# Patient Record
Sex: Female | Born: 1985 | Race: Black or African American | Hispanic: No | Marital: Married | Smoking: Never smoker
Health system: Southern US, Community
[De-identification: ages and names within clinical notes are randomized; demographics above are authoritative.]

## PROBLEM LIST (undated history)

## (undated) ENCOUNTER — Ambulatory Visit (HOSPITAL_COMMUNITY): Admission: RE | Payer: BC Managed Care – PPO | Source: Ambulatory Visit | Admitting: Obstetrics and Gynecology

## (undated) DIAGNOSIS — N390 Urinary tract infection, site not specified: Secondary | ICD-10-CM

## (undated) DIAGNOSIS — F419 Anxiety disorder, unspecified: Secondary | ICD-10-CM

## (undated) DIAGNOSIS — R011 Cardiac murmur, unspecified: Secondary | ICD-10-CM

## (undated) DIAGNOSIS — I10 Essential (primary) hypertension: Secondary | ICD-10-CM

## (undated) DIAGNOSIS — E669 Obesity, unspecified: Secondary | ICD-10-CM

## (undated) HISTORY — DX: Essential (primary) hypertension: I10

## (undated) HISTORY — DX: Obesity, unspecified: E66.9

## (undated) HISTORY — PX: WISDOM TOOTH EXTRACTION: SHX21

---
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
## 2005-03-23 HISTORY — PX: CHOLECYSTECTOMY OPEN: SUR202

## 2005-06-22 ENCOUNTER — Other Ambulatory Visit: Admission: RE | Admit: 2005-06-22 | Discharge: 2005-06-22 | Payer: Self-pay | Admitting: Obstetrics and Gynecology

## 2005-06-25 ENCOUNTER — Encounter: Admission: RE | Admit: 2005-06-25 | Discharge: 2005-06-25 | Payer: Self-pay | Admitting: Obstetrics and Gynecology

## 2005-09-04 ENCOUNTER — Emergency Department (HOSPITAL_COMMUNITY): Admission: EM | Admit: 2005-09-04 | Discharge: 2005-09-04 | Payer: Self-pay | Admitting: *Deleted

## 2006-02-12 ENCOUNTER — Ambulatory Visit (HOSPITAL_COMMUNITY): Admission: RE | Admit: 2006-02-12 | Discharge: 2006-02-12 | Payer: Self-pay | Admitting: General Surgery

## 2006-02-12 ENCOUNTER — Encounter (INDEPENDENT_AMBULATORY_CARE_PROVIDER_SITE_OTHER): Payer: Self-pay | Admitting: Specialist

## 2009-06-20 ENCOUNTER — Other Ambulatory Visit: Admission: RE | Admit: 2009-06-20 | Discharge: 2009-06-20 | Payer: Self-pay | Admitting: Obstetrics and Gynecology

## 2010-03-23 DIAGNOSIS — I10 Essential (primary) hypertension: Secondary | ICD-10-CM

## 2010-03-23 HISTORY — DX: Essential (primary) hypertension: I10

## 2010-08-08 NOTE — Op Note (Signed)
NAMEKALISI, BEVILL NO.:  [id number]   MEDICAL RECORD NO.:  [id number]          PATIENT TYPE:  AMB   LOCATION:  DAY                          FACILITY:  [facility]   PHYSICIAN:  Leonie Man, M.D.   DATE OF BIRTH:  [birth year]   DATE OF PROCEDURE:  02/12/2006  DATE OF DISCHARGE:                               OPERATIVE REPORT   PREOPERATIVE DIAGNOSIS:  Chronic calculous cholecystitis.   POSTOPERATIVE DIAGNOSIS:  Chronic calculous cholecystitis.   PROCEDURE:  Laparoscopic cholecystectomy with intraoperative  cholangiogram.   SURGEON:  Leonie Man, M.D.   ASSISTANT:  Maisie Fus A. Cornett, M.D.   ANESTHESIA:  General general.   INDICATIONS FOR PROCEDURE:  Ms. Dawn Dawson is a 25 year old female presenting  with recurrent episodes of her upper abdominal pain, associated with  nausea but without vomiting.  She has had very persistent episodes of  __________  and had been to the emergency room on several occasions for  these attacks.  The attacks tended to resolve spontaneously.  She had an  abdominal ultrasound which confirmed the presence of cholelithiasis.  The inferior extrahepatic ducts appeared to be within normal limits.  Liver function studies were also within normal limits.  The patient  comes to the operating room now after risks and potential benefits of  surgery had been discussed, all questions answered, and consent was  obtained for the same.   PROCEDURE:  The patient was positioned supine, and following the  induction of satisfactory general anesthesia, the abdomen was prepped  and draped to be included in a sterile operative field.  Open  laparoscopy was created at the umbilicus with insertion of a Hassan  cannula.  The abdominal cavity was insufflated with carbon dioxide to 14  millimeters of mercury pressure.  The camera was inserted and visual  exploration carried out.  The gallbladder was noted to be chronically  scarred with multiple adhesions up  to the wall of the gallbladder.  The  liver edge was somewhat rounded, and the liver surface was smooth.  The  anterior gastric wall and duodenal sweep appeared to be within normal  limits.  None of the large or small intestine visualized appeared to be  abnormal.   Under direct vision, epigastric and lateral ports were placed.  The  gallbladder was grasped and retracted cephalad.  Dissection was carried  down towards the ampulla, removing the adhesions of the omentum to the  gallbladder wall.  At the region of the ampulla, the cystic artery and  cystic duct were then isolated, and the cystic duct being traced up to  the cystic duct and gallbladder junction, and the cystic artery traced  into the wall of the gallbladder.  The cystic artery was then triply  clipped and transected.  The cystic duct was clipped proximally and  opened.  The cystic duct cholangiogram was carried out by passing a Cook  catheter into the abdomen and into the cystic duct, through which half-  strength Hypaque was injected under fluoroscopic control.  The resulting  cholangiogram showed prompt flow of contrast into the duodenum, normal  caliber  ducts, and no filling defects in any of the extrahepatic biliary  system.  Cystic duct catheter was removed, and the cystic duct was  triply clipped and transected.  The gallbladder was then dissected free  from the liver bed using electrocautery and maintaining hemostasis  throughout the entire course of the dissection.  At the end of  dissection, the liver bed was checked for hemostasis.  Additional  bleeding points treated electrocautery.  Right upper quadrant was  irrigated with normal saline.  The gallbladder was placed in an  Endopouch and retrieved through the umbilical wound without difficulty.   Sponge, instrument, and sharp counts were verified.  Trocars were  removed under direct vision, and pneumoperitoneum allowed to deflate.  All wounds were then closed in  layers as follows:  The umbilical wound  in two layers with #0 Dexon and 4-0 Monocryl, epigastric and flank  wounds were closed with 4-0 Monocryl sutures.  All wounds were  reinforced with Steri-Strips.  Sterile dressings applied.  Anesthetic  reversed.  The patient removed from the operating room to the recovery  room in stable condition.  She tolerated the procedure well.      Leonie Man, M.D.  Electronically Signed     PB/MEDQ  D:  02/12/2006  T:  02/12/2006  Job:  [id number]

## 2010-11-10 ENCOUNTER — Other Ambulatory Visit: Payer: Self-pay | Admitting: Family Medicine

## 2010-11-10 DIAGNOSIS — IMO0002 Reserved for concepts with insufficient information to code with codable children: Secondary | ICD-10-CM

## 2010-11-18 ENCOUNTER — Inpatient Hospital Stay: Admission: RE | Admit: 2010-11-18 | Payer: Self-pay | Source: Ambulatory Visit

## 2010-12-18 ENCOUNTER — Inpatient Hospital Stay: Admission: RE | Admit: 2010-12-18 | Payer: Self-pay | Source: Ambulatory Visit

## 2011-01-01 ENCOUNTER — Other Ambulatory Visit: Payer: Self-pay

## 2011-09-16 ENCOUNTER — Encounter: Payer: Self-pay | Admitting: Obstetrics and Gynecology

## 2011-09-16 ENCOUNTER — Ambulatory Visit (INDEPENDENT_AMBULATORY_CARE_PROVIDER_SITE_OTHER): Payer: BC Managed Care – PPO | Admitting: Obstetrics and Gynecology

## 2011-09-16 ENCOUNTER — Ambulatory Visit (INDEPENDENT_AMBULATORY_CARE_PROVIDER_SITE_OTHER): Payer: BC Managed Care – PPO

## 2011-09-16 ENCOUNTER — Other Ambulatory Visit: Payer: Self-pay | Admitting: Obstetrics and Gynecology

## 2011-09-16 DIAGNOSIS — O26849 Uterine size-date discrepancy, unspecified trimester: Secondary | ICD-10-CM

## 2011-09-16 DIAGNOSIS — Z1389 Encounter for screening for other disorder: Secondary | ICD-10-CM

## 2011-09-16 DIAGNOSIS — Z331 Pregnant state, incidental: Secondary | ICD-10-CM

## 2011-09-16 DIAGNOSIS — Z3687 Encounter for antenatal screening for uncertain dates: Secondary | ICD-10-CM

## 2011-09-16 LAB — POCT URINALYSIS DIPSTICK
Blood, UA: 2
Ketones, UA: NEGATIVE
Urobilinogen, UA: NEGATIVE
pH, UA: 6

## 2011-09-16 NOTE — Progress Notes (Signed)
Pt here for NOB interview Hx CHTN was on aldomet 500 BID - dc'd w +UPT BP today normal Review of [place] for dating only SIUP AUA = [redacted]w[redacted]d - EDC=05/03/2012 FHR 145 +amnion/yolk sac Ovaries/adenxa WNL Corpus luteum on LTOV No free fluid  Pt has NOB w/u next week

## 2011-09-17 LAB — PRENATAL PANEL VII
Antibody Screen: NEGATIVE
Basophils Absolute: 0 10*3/uL (ref 0.0–0.1)
Basophils Relative: 0 % (ref 0–1)
Eosinophils Absolute: 0.1 10*3/uL (ref 0.0–0.7)
Eosinophils Relative: 1 % (ref 0–5)
HCT: 36.7 % (ref 36.0–46.0)
HIV: NONREACTIVE
Hemoglobin: 12.3 g/dL (ref 12.0–15.0)
Lymphocytes Relative: 22 % (ref 12–46)
MCH: 26.8 pg (ref 26.0–34.0)
MCV: 80 fL (ref 78.0–100.0)
RDW: 14.4 % (ref 11.5–15.5)
Rubella: 32.7 IU/mL — ABNORMAL HIGH
WBC: 10.9 10*3/uL — ABNORMAL HIGH (ref 4.0–10.5)

## 2011-09-18 ENCOUNTER — Telehealth: Payer: Self-pay | Admitting: Obstetrics and Gynecology

## 2011-09-18 LAB — HEMOGLOBINOPATHY EVALUATION
Hemoglobin Other: 0 %
Hgb A2 Quant: 2.8 % (ref 2.2–3.2)
Hgb S Quant: 0 %

## 2011-09-18 MED ORDER — ONDANSETRON HCL 4 MG PO TABS: 4.0000 mg | ORAL_TABLET | Freq: Three times a day (TID) | ORAL | Status: AC | PRN

## 2011-09-18 NOTE — Telephone Encounter (Deleted)
Pt had nurse interview 09/16/11 and states she had a quick appointment with you afterward. Pt c/o nausea throughout the day but no vomiting. Is it okay to call in Zofran?

## 2011-09-18 NOTE — Telephone Encounter (Signed)
Zofran ok to call in for nausea per Chip Boer. Rx sent to pt pharmacy, pt notified and voiced understanding.

## 2011-09-18 NOTE — Telephone Encounter (Signed)
Triage/epic 

## 2011-09-20 DIAGNOSIS — Z22330 Carrier of Group B streptococcus: Secondary | ICD-10-CM | POA: Insufficient documentation

## 2011-09-22 ENCOUNTER — Encounter: Payer: Self-pay | Admitting: Obstetrics and Gynecology

## 2011-09-25 ENCOUNTER — Ambulatory Visit (INDEPENDENT_AMBULATORY_CARE_PROVIDER_SITE_OTHER): Payer: BC Managed Care – PPO

## 2011-09-25 VITALS — BP 122/70 | Ht 63.0 in | Wt 283.0 lb

## 2011-09-25 DIAGNOSIS — I10 Essential (primary) hypertension: Secondary | ICD-10-CM

## 2011-09-25 DIAGNOSIS — K30 Functional dyspepsia: Secondary | ICD-10-CM

## 2011-09-25 DIAGNOSIS — Z3400 Encounter for supervision of normal first pregnancy, unspecified trimester: Secondary | ICD-10-CM

## 2011-09-25 DIAGNOSIS — Z331 Pregnant state, incidental: Secondary | ICD-10-CM

## 2011-09-25 DIAGNOSIS — K3189 Other diseases of stomach and duodenum: Secondary | ICD-10-CM

## 2011-09-25 DIAGNOSIS — E669 Obesity, unspecified: Secondary | ICD-10-CM

## 2011-09-25 MED ORDER — PANTOPRAZOLE SODIUM 40 MG PO TBEC: 40.0000 mg | DELAYED_RELEASE_TABLET | Freq: Every day | ORAL | Status: DC

## 2011-09-25 NOTE — Progress Notes (Signed)
Pt c/o nausea, acid reflux. Wanting to get rx phenergan states that the zofran is making her too constipated.

## 2011-10-02 LAB — US OB TRANSVAGINAL

## 2011-10-04 DIAGNOSIS — E669 Obesity, unspecified: Secondary | ICD-10-CM

## 2011-10-04 DIAGNOSIS — I10 Essential (primary) hypertension: Secondary | ICD-10-CM | POA: Insufficient documentation

## 2011-10-04 DIAGNOSIS — Z3400 Encounter for supervision of normal first pregnancy, unspecified trimester: Secondary | ICD-10-CM | POA: Insufficient documentation

## 2011-10-04 HISTORY — DX: Obesity, unspecified: E66.9

## 2011-10-04 NOTE — Progress Notes (Signed)
..   Subjective:    Dawn Dawson is being seen today for her first obstetrical visit at [redacted]w[redacted]d.  She reports indigestion & constipation; some nausea.  Her obstetrical history is significant for: Patient Active Problem List  Diagnosis  . GBS carrier  . Obese  . Pregnancy, first  . HTN (hypertension)    Relationship with FOB:  Involved and at appt.  Patient does intend to breast feed.   Pregnancy history fully reviewed.  The following portions of the patient's history were reviewed and updated as appropriate: allergies, current medications, past family history, past medical history, past social history, past surgical history and problem list.  Review of Systems Pertinent ROS is described in HPI   Objective:   BP 122/70  Ht 5\' 3"  (1.6 m)  Wt 283 lb (128.368 kg)  BMI 50.13 kg/m2 Wt Readings from Last 1 Encounters:  09/25/11 283 lb (128.368 kg)   BMI: Body mass index is 50.13 kg/(m^2).  General: alert, cooperative and no distress Respiratory: clear to auscultation bilaterally Cardiovascular: regular rate and rhythm Breasts:  No dominant masses, nipples erect Gastrointestinal: soft, non-tender; no masses,  no organomegaly Extremities: extremities normal, no pain or edema Vaginal Bleeding: None  Pelvic deferred today. Pt unsure of last Pap and going to get exact date by next visit. Her birth month is Sept, so pt agreeable to do then if has been 104yr by that point. FHR:  N/a.  Assessment:    Pregnancy at  [redacted]w[redacted]d. Obese. CHTN--on no meds currently.  GBS pos bacteriuria. Plan:     Prenatal panel reviewed and discussed with the patient:yes Pap smear collected:no GC/Chlamydia collected:no Wet prep:  N/a. Discussion of Genetic testing options: to decide on 1st trimester screen. Prenatal vitamins recommended Problem list reviewed and updated.  Plan of care: Follow up in 4 weeks for ROB or prn. Protonix rx'd.  Disc'd diet and weight gain at length. Plan early 1hr  gtt.  Antonietta Breach CNM, [place] 10/04/2011 2:56 PM

## 2011-10-20 ENCOUNTER — Ambulatory Visit (INDEPENDENT_AMBULATORY_CARE_PROVIDER_SITE_OTHER): Payer: BC Managed Care – PPO

## 2011-10-20 ENCOUNTER — Ambulatory Visit (INDEPENDENT_AMBULATORY_CARE_PROVIDER_SITE_OTHER): Payer: BC Managed Care – PPO | Admitting: Obstetrics and Gynecology

## 2011-10-20 ENCOUNTER — Encounter: Payer: Self-pay | Admitting: Obstetrics and Gynecology

## 2011-10-20 VITALS — BP 116/74 | Wt 278.0 lb

## 2011-10-20 DIAGNOSIS — Z331 Pregnant state, incidental: Secondary | ICD-10-CM

## 2011-10-20 DIAGNOSIS — E669 Obesity, unspecified: Secondary | ICD-10-CM

## 2011-10-20 DIAGNOSIS — O368390 Maternal care for abnormalities of the fetal heart rate or rhythm, unspecified trimester, not applicable or unspecified: Secondary | ICD-10-CM

## 2011-10-20 LAB — US OB LIMITED

## 2011-10-20 NOTE — Progress Notes (Signed)
C/O: indigestion, meds are not helping. Pls resend rx for protonix to pharmacy again was not at pharmacy when she went for pick up.

## 2011-10-20 NOTE — Progress Notes (Signed)
Patient ID: [name], female   DOB: [birth year], 26 y.o.   MRN: [id number] [place] for fhts 159. Travel precautions discussed 8 water daily, frequent voids, ambulate q 2 hours to avoid blood clots. Lavera Guise, CNM

## 2011-11-18 ENCOUNTER — Encounter: Payer: Self-pay | Admitting: Obstetrics and Gynecology

## 2011-11-18 ENCOUNTER — Ambulatory Visit (INDEPENDENT_AMBULATORY_CARE_PROVIDER_SITE_OTHER): Payer: BC Managed Care – PPO | Admitting: Obstetrics and Gynecology

## 2011-11-18 VITALS — BP 130/78 | Wt 281.0 lb

## 2011-11-18 DIAGNOSIS — Z3689 Encounter for other specified antenatal screening: Secondary | ICD-10-CM

## 2011-11-18 DIAGNOSIS — Z331 Pregnant state, incidental: Secondary | ICD-10-CM

## 2011-11-18 NOTE — Progress Notes (Signed)
C/o HA's no visual disturbances  C/o aching "cramping" in legs C/o NV wants to change from Zofran to Phenergan

## 2011-11-18 NOTE — Progress Notes (Signed)
Mild HAs, temporal--1-2x  Since LV.  Some leg cramps at night. Nausea still persists, but is better.  Using Zofran, but makes sleepy.  Discussed Phenergan will do the same and likely cause more sedation.  Patient will stick with Zofran. 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 [place] at 20 weeks at [facility] due to body habitus. Declines genetic testing. Glucola NV.

## 2011-11-19 ENCOUNTER — Telehealth: Payer: Self-pay

## 2011-11-19 NOTE — Telephone Encounter (Signed)
Scheduled ana u/s @ 10:30 am due to elevated BMI at [facility], 1 gtt, and ROB with VL 12/11/11 after u/s. Pt agrees and voices understanding.

## 2011-12-02 ENCOUNTER — Telehealth: Payer: Self-pay | Admitting: Obstetrics and Gynecology

## 2011-12-02 NOTE — Telephone Encounter (Signed)
Pt was called back and made aware she can take Robitussin (plain) Pt asked is can use tablets  I consulted w/ NS , RN and she advised that its best to take the syrup robitussin. Pt voiced understanding.  [facility] CMA

## 2011-12-04 ENCOUNTER — Telehealth: Payer: Self-pay | Admitting: Obstetrics and Gynecology

## 2011-12-04 NOTE — Telephone Encounter (Signed)
Tc from pt. Pt c/o abdominal discomfort;no pain. No vaginal bldg or abn discharge. Pt admits to not drinking adequate water intake daily. Informed pt to increase water intake(64oz daily), try otc Tylenol or Ibuprofen prn, warm tub bath, warm heating pad/compress to area. If no improvement, pt to call after hours. Pt voices understanding.

## 2011-12-04 NOTE — Telephone Encounter (Signed)
Lm on vm to cb per telephone call.  

## 2011-12-11 ENCOUNTER — Ambulatory Visit (INDEPENDENT_AMBULATORY_CARE_PROVIDER_SITE_OTHER): Payer: BC Managed Care – PPO | Admitting: Obstetrics and Gynecology

## 2011-12-11 ENCOUNTER — Encounter: Payer: Self-pay | Admitting: Obstetrics and Gynecology

## 2011-12-11 ENCOUNTER — Ambulatory Visit (HOSPITAL_COMMUNITY)
Admission: RE | Admit: 2011-12-11 | Discharge: 2011-12-11 | Disposition: A | Discharge status: Home / Self Care | Payer: BC Managed Care – PPO | Source: Ambulatory Visit | Attending: Obstetrics and Gynecology | Admitting: Obstetrics and Gynecology

## 2011-12-11 VITALS — BP 112/68 | Wt 285.0 lb

## 2011-12-11 DIAGNOSIS — O99210 Obesity complicating pregnancy, unspecified trimester: Secondary | ICD-10-CM | POA: Insufficient documentation

## 2011-12-11 DIAGNOSIS — Z3490 Encounter for supervision of normal pregnancy, unspecified, unspecified trimester: Secondary | ICD-10-CM

## 2011-12-11 DIAGNOSIS — O10019 Pre-existing essential hypertension complicating pregnancy, unspecified trimester: Secondary | ICD-10-CM | POA: Insufficient documentation

## 2011-12-11 DIAGNOSIS — Z3689 Encounter for other specified antenatal screening: Secondary | ICD-10-CM

## 2011-12-11 DIAGNOSIS — E669 Obesity, unspecified: Secondary | ICD-10-CM | POA: Insufficient documentation

## 2011-12-11 DIAGNOSIS — Z331 Pregnant state, incidental: Secondary | ICD-10-CM

## 2011-12-11 LAB — HEMOGLOBIN: Hemoglobin: 11 g/dL — ABNORMAL LOW (ref 12.0–15.0)

## 2011-12-11 NOTE — Progress Notes (Signed)
Doing well. [place] at [facility] today:  20 weeks, EDC 05/03/12, c/w dates. Cervix 5.01.  Placenta posterior, female Limited anatomy views. (due to body habitus). Will schedule repeat in 4 weeks at [facility]. Glucola today due to BMI.

## 2011-12-11 NOTE — Progress Notes (Signed)
[redacted]w[redacted]d Pt had u/s at [facility] Glucola Given

## 2011-12-12 LAB — GLUCOSE TOLERANCE, 1 HOUR (50G) W/O FASTING: Glucose, 1 Hour GTT: 88 mg/dL (ref 70–140)

## 2011-12-12 LAB — RPR

## 2012-01-08 ENCOUNTER — Encounter: Payer: BC Managed Care – PPO | Admitting: Obstetrics and Gynecology

## 2012-01-12 ENCOUNTER — Ambulatory Visit (INDEPENDENT_AMBULATORY_CARE_PROVIDER_SITE_OTHER): Payer: BC Managed Care – PPO | Admitting: Obstetrics and Gynecology

## 2012-01-12 ENCOUNTER — Encounter: Payer: Self-pay | Admitting: Obstetrics and Gynecology

## 2012-01-12 ENCOUNTER — Telehealth: Payer: Self-pay | Admitting: Obstetrics and Gynecology

## 2012-01-12 VITALS — BP 158/70 | Wt 292.0 lb

## 2012-01-12 DIAGNOSIS — E669 Obesity, unspecified: Secondary | ICD-10-CM

## 2012-01-12 DIAGNOSIS — Z331 Pregnant state, incidental: Secondary | ICD-10-CM

## 2012-01-12 DIAGNOSIS — F411 Generalized anxiety disorder: Secondary | ICD-10-CM

## 2012-01-12 DIAGNOSIS — I10 Essential (primary) hypertension: Secondary | ICD-10-CM

## 2012-01-12 DIAGNOSIS — O99340 Other mental disorders complicating pregnancy, unspecified trimester: Secondary | ICD-10-CM

## 2012-01-12 MED ORDER — METHYLDOPA 250 MG PO TABS: 250.0000 mg | ORAL_TABLET | Freq: Two times a day (BID) | ORAL | Status: DC

## 2012-01-12 MED ORDER — SERTRALINE HCL 50 MG PO TABS: 50.0000 mg | ORAL_TABLET | Freq: Every day | ORAL | Status: DC

## 2012-01-12 MED ORDER — SERTRALINE HCL 50 MG PO TABS
50.0000 mg | ORAL_TABLET | Freq: Every day | ORAL | Status: DC
Start: 2012-01-12 — End: 2020-08-15

## 2012-01-12 NOTE — Progress Notes (Signed)
BP med discontinued early in pregnancy with normal BPs until now.  Will restart at Aldomet 250 mg BID.  Has appt for [place] on 01/15/12.  Will recheck BP at that time to determine need for increase. C/o new onset panic attacks last week.  Afraid to sleep.  Feels as if her baby is coming into her chest.No hx panic attacks in past.  Denies depression.  Will start Zoloft 50 mg daily and reassess at NV

## 2012-01-12 NOTE — Telephone Encounter (Signed)
Pt called, is 24 wks, states BP's have been elevated for past couple weeks ranging 150's/80's.  Has swelling in hands and feet and has been waking up with HA's but will go away if she takes Tylenol.  Pt also says she has been having severe anxiety attacks @ times.  Pt says was taken off BP meds @ the beginning of her pregnancy.  Pt scheduled an appt w/ [facility] today @ 1630 for eval, pt voices agreement.

## 2012-01-12 NOTE — Progress Notes (Signed)
[redacted]w[redacted]d Pt c/o increased bp and also having severe anxiety. States that bp has been staying in the 150/80 range. And she has been having a lot of headaches.

## 2012-01-15 ENCOUNTER — Encounter: Payer: Self-pay | Admitting: Obstetrics and Gynecology

## 2012-01-15 ENCOUNTER — Ambulatory Visit (HOSPITAL_COMMUNITY)
Admission: RE | Admit: 2012-01-15 | Discharge: 2012-01-15 | Disposition: A | Discharge status: Home / Self Care | Payer: BC Managed Care – PPO | Source: Ambulatory Visit | Attending: Obstetrics and Gynecology | Admitting: Obstetrics and Gynecology

## 2012-01-15 ENCOUNTER — Other Ambulatory Visit: Payer: Self-pay | Admitting: Obstetrics and Gynecology

## 2012-01-15 ENCOUNTER — Ambulatory Visit (INDEPENDENT_AMBULATORY_CARE_PROVIDER_SITE_OTHER): Payer: BC Managed Care – PPO | Admitting: Obstetrics and Gynecology

## 2012-01-15 VITALS — BP 120/70 | Wt 291.0 lb

## 2012-01-15 DIAGNOSIS — F411 Generalized anxiety disorder: Secondary | ICD-10-CM

## 2012-01-15 DIAGNOSIS — O169 Unspecified maternal hypertension, unspecified trimester: Secondary | ICD-10-CM

## 2012-01-15 DIAGNOSIS — Z3689 Encounter for other specified antenatal screening: Secondary | ICD-10-CM

## 2012-01-15 DIAGNOSIS — I10 Essential (primary) hypertension: Secondary | ICD-10-CM

## 2012-01-15 DIAGNOSIS — O10919 Unspecified pre-existing hypertension complicating pregnancy, unspecified trimester: Secondary | ICD-10-CM

## 2012-01-15 DIAGNOSIS — F419 Anxiety disorder, unspecified: Secondary | ICD-10-CM

## 2012-01-15 DIAGNOSIS — O10019 Pre-existing essential hypertension complicating pregnancy, unspecified trimester: Secondary | ICD-10-CM

## 2012-01-15 NOTE — Progress Notes (Signed)
[redacted]w[redacted]d No concerns per pt

## 2012-01-15 NOTE — Progress Notes (Signed)
Doing well.  Started Zoloft 50 mg po daily and restarted Aldomet 250 mg po BID after 01/12/12 visit with [facility]. Feels better last 2 days.  FOB and patient's father with her today.  Glucola WNL at 19 weeks. [place] at [facility] today:  Completion of normal anatomy, normal cervical length, anterior placenta.  Breech. Will plan [place] for growth and fluid q 4 weeks, with addition of weekly BPPs beginning at 32 weeks. Glucola NV

## 2012-02-15 ENCOUNTER — Ambulatory Visit (INDEPENDENT_AMBULATORY_CARE_PROVIDER_SITE_OTHER): Payer: BC Managed Care – PPO | Admitting: Obstetrics and Gynecology

## 2012-02-15 ENCOUNTER — Other Ambulatory Visit: Payer: BC Managed Care – PPO

## 2012-02-15 ENCOUNTER — Ambulatory Visit (INDEPENDENT_AMBULATORY_CARE_PROVIDER_SITE_OTHER): Payer: BC Managed Care – PPO

## 2012-02-15 VITALS — BP 118/76 | Wt 291.0 lb

## 2012-02-15 DIAGNOSIS — Z331 Pregnant state, incidental: Secondary | ICD-10-CM

## 2012-02-15 DIAGNOSIS — Z3400 Encounter for supervision of normal first pregnancy, unspecified trimester: Secondary | ICD-10-CM

## 2012-02-15 DIAGNOSIS — F419 Anxiety disorder, unspecified: Secondary | ICD-10-CM

## 2012-02-15 DIAGNOSIS — O10019 Pre-existing essential hypertension complicating pregnancy, unspecified trimester: Secondary | ICD-10-CM

## 2012-02-15 DIAGNOSIS — F411 Generalized anxiety disorder: Secondary | ICD-10-CM

## 2012-02-15 DIAGNOSIS — E669 Obesity, unspecified: Secondary | ICD-10-CM

## 2012-02-15 DIAGNOSIS — Z23 Encounter for immunization: Secondary | ICD-10-CM

## 2012-02-15 DIAGNOSIS — O10919 Unspecified pre-existing hypertension complicating pregnancy, unspecified trimester: Secondary | ICD-10-CM

## 2012-02-15 LAB — US OB FOLLOW UP

## 2012-02-15 LAB — CBC
Hemoglobin: 10.9 g/dL — ABNORMAL LOW (ref 12.0–15.0)
MCH: 27 pg (ref 26.0–34.0)
MCV: 79.5 fL (ref 78.0–100.0)
RBC: 4.04 MIL/uL (ref 3.87–5.11)

## 2012-02-15 NOTE — Progress Notes (Signed)
Pt stated she has been having headaches . Pt stated no other issues today. Flu shot given today. Glucola given at 1:28 draw @ 2:28  Ultrasound shows:  SIUP  S=D     [place] EDD: 05/03/2012                                  EFW : 3lbs 3oz 58%tile           AFI: 14.8           Cervical length: 3.69 cm           Placenta localization: posterior           Fetal presentation: breech  Comments:Normal Fluid:AFI of 14.8 cm =50th % tile. Normal linear growth: EFW=68th % tile. CX closed. Normal adnexa   Feels less anxious on current dose of Zoloft Next U/S at 32 weeks with BPP

## 2012-02-16 LAB — RPR

## 2012-02-29 ENCOUNTER — Ambulatory Visit (INDEPENDENT_AMBULATORY_CARE_PROVIDER_SITE_OTHER): Payer: BC Managed Care – PPO | Admitting: Obstetrics and Gynecology

## 2012-02-29 ENCOUNTER — Encounter: Payer: Self-pay | Admitting: Obstetrics and Gynecology

## 2012-02-29 VITALS — BP 136/82 | Wt 298.0 lb

## 2012-02-29 DIAGNOSIS — O169 Unspecified maternal hypertension, unspecified trimester: Secondary | ICD-10-CM

## 2012-02-29 NOTE — Progress Notes (Signed)
Pt c/o back pain, pt had flu vaccine at last visit. Pt states she was supposed to have Pap done in Sept, would like to know if it will be done with gbs.

## 2012-02-29 NOTE — Progress Notes (Signed)
[redacted]w[redacted]d Pt c/o of chronic back pain [place] @NV  for growth Info on back pain and preg given to pt.    She declined cx exam

## 2012-02-29 NOTE — Patient Instructions (Signed)

## 2012-03-02 ENCOUNTER — Telehealth: Payer: Self-pay | Admitting: Obstetrics and Gynecology

## 2012-03-02 NOTE — Telephone Encounter (Signed)
TC from pt  States has felt has though heart is racing.  Took BP at pharmacy and was 158/111. Had taken Aldomet several hours before. Both feet have increased edema, lt more than rt.  No headaches.  NO vision disturbances. +FM.  Per VL, pt to MAU.  Pt states is in [place] and would be more comfortable being seen there since [facility] 1 hour away. ADvised OK as long as has eval. Pt verbalizes comprehension.

## 2012-03-14 ENCOUNTER — Ambulatory Visit (INDEPENDENT_AMBULATORY_CARE_PROVIDER_SITE_OTHER): Payer: BC Managed Care – PPO

## 2012-03-14 ENCOUNTER — Other Ambulatory Visit: Payer: Self-pay | Admitting: Obstetrics and Gynecology

## 2012-03-14 ENCOUNTER — Ambulatory Visit (INDEPENDENT_AMBULATORY_CARE_PROVIDER_SITE_OTHER): Payer: BC Managed Care – PPO | Admitting: Obstetrics and Gynecology

## 2012-03-14 VITALS — BP 132/84 | Wt 305.0 lb

## 2012-03-14 DIAGNOSIS — O169 Unspecified maternal hypertension, unspecified trimester: Secondary | ICD-10-CM

## 2012-03-14 DIAGNOSIS — I10 Essential (primary) hypertension: Secondary | ICD-10-CM

## 2012-03-14 NOTE — Progress Notes (Signed)
Pt stated she went to the hospital in [place] for hypertension on 03/02/2012. Pt stated no issues today.

## 2012-03-14 NOTE — Progress Notes (Signed)
[redacted]w[redacted]d Ultrasound: Single gestation, normal fluid, vertex, cervix 5.31 cm, 4 lbs. 15 oz. (64th percentile), biophysical profile 8 out of 8. Return office in 1 week. Biophysical profile next week. Dr. Stefano Gaul

## 2012-03-18 LAB — US OB FOLLOW UP

## 2012-03-21 ENCOUNTER — Other Ambulatory Visit: Payer: Self-pay

## 2012-03-21 DIAGNOSIS — O10019 Pre-existing essential hypertension complicating pregnancy, unspecified trimester: Secondary | ICD-10-CM

## 2012-03-24 ENCOUNTER — Inpatient Hospital Stay (HOSPITAL_COMMUNITY)
Admission: AD | Admit: 2012-03-24 | Discharge: 2012-03-24 | Disposition: A | Discharge status: Home / Self Care | Payer: BC Managed Care – PPO | Source: Ambulatory Visit | Attending: Obstetrics and Gynecology | Admitting: Obstetrics and Gynecology

## 2012-03-24 ENCOUNTER — Ambulatory Visit (INDEPENDENT_AMBULATORY_CARE_PROVIDER_SITE_OTHER): Payer: BC Managed Care – PPO | Admitting: Obstetrics and Gynecology

## 2012-03-24 ENCOUNTER — Ambulatory Visit (INDEPENDENT_AMBULATORY_CARE_PROVIDER_SITE_OTHER): Payer: BC Managed Care – PPO

## 2012-03-24 ENCOUNTER — Encounter: Payer: Self-pay | Admitting: Obstetrics and Gynecology

## 2012-03-24 ENCOUNTER — Encounter (HOSPITAL_COMMUNITY): Payer: Self-pay | Admitting: *Deleted

## 2012-03-24 VITALS — BP 144/100 | Wt 313.0 lb

## 2012-03-24 DIAGNOSIS — E669 Obesity, unspecified: Secondary | ICD-10-CM | POA: Insufficient documentation

## 2012-03-24 DIAGNOSIS — R609 Edema, unspecified: Secondary | ICD-10-CM

## 2012-03-24 DIAGNOSIS — O10019 Pre-existing essential hypertension complicating pregnancy, unspecified trimester: Secondary | ICD-10-CM

## 2012-03-24 DIAGNOSIS — O99013 Anemia complicating pregnancy, third trimester: Secondary | ICD-10-CM | POA: Insufficient documentation

## 2012-03-24 DIAGNOSIS — O99210 Obesity complicating pregnancy, unspecified trimester: Secondary | ICD-10-CM

## 2012-03-24 DIAGNOSIS — Z331 Pregnant state, incidental: Secondary | ICD-10-CM

## 2012-03-24 DIAGNOSIS — O169 Unspecified maternal hypertension, unspecified trimester: Secondary | ICD-10-CM

## 2012-03-24 HISTORY — DX: Cardiac murmur, unspecified: R01.1

## 2012-03-24 HISTORY — DX: Urinary tract infection, site not specified: N39.0

## 2012-03-24 LAB — COMPREHENSIVE METABOLIC PANEL
AST: 20 U/L (ref 0–37)
Albumin: 2.1 g/dL — ABNORMAL LOW (ref 3.5–5.2)
Chloride: 102 mEq/L (ref 96–112)
Creatinine, Ser: 0.69 mg/dL (ref 0.50–1.10)
Total Bilirubin: 0.3 mg/dL (ref 0.3–1.2)
Total Protein: 6.3 g/dL (ref 6.0–8.3)

## 2012-03-24 LAB — CBC
HCT: 29.3 % — ABNORMAL LOW (ref 36.0–46.0)
Hemoglobin: 9.5 g/dL — ABNORMAL LOW (ref 12.0–15.0)
MCH: 26.2 pg (ref 26.0–34.0)
MCV: 80.7 fL (ref 78.0–100.0)
RBC: 3.63 MIL/uL — ABNORMAL LOW (ref 3.87–5.11)
WBC: 12.3 10*3/uL — ABNORMAL HIGH (ref 4.0–10.5)

## 2012-03-24 MED ORDER — METHYLDOPA 250 MG PO TABS
250.0000 mg | ORAL_TABLET | Freq: Once | ORAL | Status: AC
  Administered 2012-03-24: 250 mg via ORAL
  Filled 2012-03-24: qty 1

## 2012-03-24 NOTE — MAU Note (Signed)
Sent from office, hx of CHTN on meds, here for further eval

## 2012-03-24 NOTE — Progress Notes (Signed)
[redacted]w[redacted]d BPP 8/8 vtx posterior placenta Pt states she did not take meds this am.  She denies HA, blurred vision or RUQ pain Physical Examination: General appearance - alert, well appearing, and in no distress Mental status - alert, oriented to person, place, and time Chest - clear to auscultation, no wheezes, rales or rhonchi, symmetric air entry Heart - normal rate and regular rhythm Abdomen - soft, nontender, nondistended, no masses or organomegaly Extremities - peripheral pulses normal, no pedal edema, no clubbing or cyanosis, pedal edema 1 + CHTN BP elevated to MAU Give aldomet Check PIH labs Do 24 hour urine NST and serial BPS [place] @NV  for growth and BPP

## 2012-03-24 NOTE — Progress Notes (Signed)
Pt did not take BP meds today  

## 2012-03-24 NOTE — Patient Instructions (Signed)
Fetal Movement Counts Patient Name: __________________________________________________ Patient Due Date: ____________________ Kick counts is highly recommended in high risk pregnancies, but it is a good idea for every pregnant woman to do. Start counting fetal movements at 28 weeks of the pregnancy. Fetal movements increase after eating a full meal or eating or drinking something sweet (the blood sugar is higher). It is also important to drink plenty of fluids (well hydrated) before doing the count. Lie on your left side because it helps with the circulation or you can sit in a comfortable chair with your arms over your belly (abdomen) with no distractions around you. DOING THE COUNT  Try to do the count the same time of day each time you do it.  Mark the day and time, then see how long it takes for you to feel 10 movements (kicks, flutters, swishes, rolls). You should have at least 10 movements within 2 hours. You will most likely feel 10 movements in much less than 2 hours. If you do not, wait an hour and count again. After a couple of days you will see a pattern.  What you are looking for is a change in the pattern or not enough counts in 2 hours. Is it taking longer in time to reach 10 movements? SEEK MEDICAL CARE IF:  You feel less than 10 counts in 2 hours. Tried twice.  No movement in one hour.  The pattern is changing or taking longer each day to reach 10 counts in 2 hours.  You feel the baby is not moving as it usually does. Date: ____________ Movements: ____________ Start time: ____________ Finish time: ____________  Date: ____________ Movements: ____________ Start time: ____________ Finish time: ____________ Date: ____________ Movements: ____________ Start time: ____________ Finish time: ____________ Date: ____________ Movements: ____________ Start time: ____________ Finish time: ____________ Date: ____________ Movements: ____________ Start time: ____________ Finish time:  ____________ Date: ____________ Movements: ____________ Start time: ____________ Finish time: ____________ Date: ____________ Movements: ____________ Start time: ____________ Finish time: ____________ Date: ____________ Movements: ____________ Start time: ____________ Finish time: ____________  Date: ____________ Movements: ____________ Start time: ____________ Finish time: ____________ Date: ____________ Movements: ____________ Start time: ____________ Finish time: ____________ Date: ____________ Movements: ____________ Start time: ____________ Finish time: ____________ Date: ____________ Movements: ____________ Start time: ____________ Finish time: ____________ Date: ____________ Movements: ____________ Start time: ____________ Finish time: ____________ Date: ____________ Movements: ____________ Start time: ____________ Finish time: ____________ Date: ____________ Movements: ____________ Start time: ____________ Finish time: ____________  Date: ____________ Movements: ____________ Start time: ____________ Finish time: ____________ Date: ____________ Movements: ____________ Start time: ____________ Finish time: ____________ Date: ____________ Movements: ____________ Start time: ____________ Finish time: ____________ Date: ____________ Movements: ____________ Start time: ____________ Finish time: ____________ Date: ____________ Movements: ____________ Start time: ____________ Finish time: ____________ Date: ____________ Movements: ____________ Start time: ____________ Finish time: ____________ Date: ____________ Movements: ____________ Start time: ____________ Finish time: ____________  Date: ____________ Movements: ____________ Start time: ____________ Finish time: ____________ Date: ____________ Movements: ____________ Start time: ____________ Finish time: ____________ Date: ____________ Movements: ____________ Start time: ____________ Finish time: ____________ Date: ____________ Movements:  ____________ Start time: ____________ Finish time: ____________ Date: ____________ Movements: ____________ Start time: ____________ Finish time: ____________ Date: ____________ Movements: ____________ Start time: ____________ Finish time: ____________ Date: ____________ Movements: ____________ Start time: ____________ Finish time: ____________  Date: ____________ Movements: ____________ Start time: ____________ Finish time: ____________ Date: ____________ Movements: ____________ Start time: ____________ Finish time: ____________ Date: ____________ Movements: ____________ Start time: ____________ Finish time: ____________ Date: ____________ Movements:   ____________ Start time: ____________ Finish time: ____________ Date: ____________ Movements: ____________ Start time: ____________ Finish time: ____________ Date: ____________ Movements: ____________ Start time: ____________ Finish time: ____________ Date: ____________ Movements: ____________ Start time: ____________ Finish time: ____________  Date: ____________ Movements: ____________ Start time: ____________ Finish time: ____________ Date: ____________ Movements: ____________ Start time: ____________ Finish time: ____________ Date: ____________ Movements: ____________ Start time: ____________ Finish time: ____________ Date: ____________ Movements: ____________ Start time: ____________ Finish time: ____________ Date: ____________ Movements: ____________ Start time: ____________ Finish time: ____________ Date: ____________ Movements: ____________ Start time: ____________ Finish time: ____________ Date: ____________ Movements: ____________ Start time: ____________ Finish time: ____________  Date: ____________ Movements: ____________ Start time: ____________ Finish time: ____________ Date: ____________ Movements: ____________ Start time: ____________ Finish time: ____________ Date: ____________ Movements: ____________ Start time: ____________ Finish  time: ____________ Date: ____________ Movements: ____________ Start time: ____________ Finish time: ____________ Date: ____________ Movements: ____________ Start time: ____________ Finish time: ____________ Date: ____________ Movements: ____________ Start time: ____________ Finish time: ____________ Date: ____________ Movements: ____________ Start time: ____________ Finish time: ____________  Date: ____________ Movements: ____________ Start time: ____________ Finish time: ____________ Date: ____________ Movements: ____________ Start time: ____________ Finish time: ____________ Date: ____________ Movements: ____________ Start time: ____________ Finish time: ____________ Date: ____________ Movements: ____________ Start time: ____________ Finish time: ____________ Date: ____________ Movements: ____________ Start time: ____________ Finish time: ____________ Date: ____________ Movements: ____________ Start time: ____________ Finish time: ____________ Document Released: 04/08/2006 Document Revised: 06/01/2011 Document Reviewed: 10/09/2008 [employer] Patient Information 2013 [employer].  

## 2012-03-24 NOTE — MAU Provider Note (Signed)
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 She is on Aldomet 250mg  po bid for [facility] and did not take AM dose before her office appt.  BP at office=144/100.  She had a BPP at office and 8/8; vtx, posterior placenta.  She denies PIH s/s.  No ctxs.  GFM.  No LOF, VB, or abnl d/c.  No UTI s/s.  No resp or GI c/o's.  She states she has not completed a 24 hr urine this pregnancy.    .. Patient Active Problem List  Diagnosis  . GBS carrier  . Obese  . Pregnancy, first  . HTN (hypertension)  . Anxiety  . Anemia complicating pregnancy in third trimester    CSN: [id number]  Arrival date and time: 03/24/12 1131   None     Chief Complaint  Patient presents with  . chronic HTN vs PIH    HPI  OB History    Grav Para Term Preterm Abortions TAB SAB Ect Mult Living   1               Past Medical History  Diagnosis Date  . Hypertension 2012    WAS TAKING ALDOMET  . Obese 10/04/2011    pregravid BMI=48  . HTN (hypertension)     Stopped Aldomet w/ positive UPT  . Urinary tract infection   . Heart murmur     as child    Past Surgical History  Procedure Date  . Wisdom tooth extraction     ALL 4 EXTRACTED  . Cholecystectomy open 2007    NO COMPLICATIONS    Family History  Problem Relation Age of Onset  . Hypertension Father   . Kidney disease Maternal Grandmother     DIALYSIS  . Seizures Maternal Uncle   . Emphysema Maternal Uncle   . Emphysema Cousin     MATERNAL  . Other Neg Hx     History  Substance Use Topics  . Smoking status: Never Smoker   . Smokeless tobacco: Never Used  . Alcohol Use: No     Comment: SOCIAL DRINKER ONLY    Allergies: No Known Allergies  Prescriptions prior to admission  Medication Sig Dispense Refill  . calcium elemental as carbonate (TUMS ULTRA 1000) 400 MG tablet Chew 1,000 mg by mouth as needed.      . methyldopa (ALDOMET) 250 MG tablet Take 1 tablet (250 mg total) by mouth 2 (two) times  daily.  60 tablet  6  . Prenatal Vit w/Fe-Methylfol-FA (PNV PO) Take by mouth daily.      . ranitidine (ZANTAC) 150 MG tablet Take 150 mg by mouth as needed.      . sertraline (ZOLOFT) 50 MG tablet Take 1 tablet (50 mg total) by mouth daily.  30 tablet  6  . [DISCONTINUED] alum & mag hydroxide-simeth (MAALOX/MYLANTA) 200-200-20 MG/5ML suspension Take 15 mLs by mouth every 6 (six) hours as needed.      . [DISCONTINUED] ondansetron (ZOFRAN) 4 MG tablet Take 4 mg by mouth every 8 (eight) hours as needed.      . [DISCONTINUED] pantoprazole (PROTONIX) 40 MG tablet Take 1 tablet (40 mg total) by mouth daily.  30 tablet  11    ROS--see HPI above Physical Exam   Blood pressure 146/91, pulse 97, temperature 98.4 F (36.9 C), temperature source Oral, resp. rate 20, height 5\' 2"  (1.575 m), weight 312 lb (141.522 kg). .. Filed Vitals:  03/24/12 1143 03/24/12 1200 03/24/12 1217 03/24/12 1232  BP:  154/98 153/94 146/91  Pulse:  100 102 97  Temp:  98.4 F (36.9 C)    TempSrc:  Oral    Resp:  20    Height: 5\' 2"  (1.575 m)     Weight: 312 lb (141.522 kg)      Results for orders placed during the hospital encounter of 03/24/12 (from the past 24 hour(s))  COMPREHENSIVE METABOLIC PANEL     Status: Abnormal   Collection Time   03/24/12 12:45 PM      Component Value Range   Sodium 134 (*) 135 - 145 mEq/L   Potassium 4.0  3.5 - 5.1 mEq/L   Chloride 102  96 - 112 mEq/L   CO2 20  19 - 32 mEq/L   Glucose, Bld 81  70 - 99 mg/dL   BUN 8  6 - 23 mg/dL   Creatinine, Ser 4.09  0.50 - 1.10 mg/dL   Calcium 9.1  8.4 - 81.1 mg/dL   Total Protein 6.3  6.0 - 8.3 g/dL   Albumin 2.1 (*) 3.5 - 5.2 g/dL   AST 20  0 - 37 U/L   ALT 14  0 - 35 U/L   Alkaline Phosphatase 192 (*) 39 - 117 U/L   Total Bilirubin 0.3  0.3 - 1.2 mg/dL   GFR calc non Af Amer >90  >90 mL/min   GFR calc Af Amer >90  >90 mL/min  CBC     Status: Abnormal   Collection Time   03/24/12 12:45 PM      Component Value Range   WBC 12.3 (*) 4.0 -  10.5 K/uL   RBC 3.63 (*) 3.87 - 5.11 MIL/uL   Hemoglobin 9.5 (*) 12.0 - 15.0 g/dL   HCT 91.4 (*) 78.2 - 95.6 %   MCV 80.7  78.0 - 100.0 fL   MCH 26.2  26.0 - 34.0 pg   MCHC 32.4  30.0 - 36.0 g/dL   RDW 21.3  08.6 - 57.8 %   Platelets 314  150 - 400 K/uL  LACTATE DEHYDROGENASE     Status: Normal   Collection Time   03/24/12 12:45 PM      Component Value Range   LDH 182  94 - 250 U/L  URIC ACID     Status: Normal   Collection Time   03/24/12 12:45 PM      Component Value Range   Uric Acid, Serum 3.0  2.4 - 7.0 mg/dL   Physical Exam  Constitutional: She is oriented to person, place, and time. She appears well-developed and well-nourished. No distress.       Sleeping on my arrival to room  HENT:  Head: Normocephalic and atraumatic.  Eyes: Pupils are equal, round, and reactive to light.  Cardiovascular: Normal rate.   Respiratory: Effort normal.  GI: Soft.       gravid  Genitourinary:       Pelvic exam deferred  Musculoskeletal: She exhibits edema.       1+ BLE edema, no clonus  Neurological: She is alert and oriented to person, place, and time. She has normal reflexes.  Skin: Skin is warm and dry.  Psychiatric: She has a normal mood and affect. Her behavior is normal. Judgment and thought content normal.    MAU Course  Procedures 1. NST: 135, reactive, no decels, moderate variability; TOCO: rare ctx 2. Serial BP's, and PIH labs 3. Aldomet 250mg  po x1  Assessment and Plan  1. [redacted]w[redacted]d 2. CHTN 3. Morbidly obese 4. nml PIH labs 5. Reactive NST w/ 10/10 antenatal testing total today 6. Lower Hgb today   1. Per c/w Dr. Normand Sloop, d/c home w/ [facility] precautions 2. She is to start a 24 hr urine tomorrow morning and bring to hospital Sat morning, 03/26/12, to turn in and get BP checked. 3. F/u 03/31/12 in office for NV, or prn 4. Rec'd pt take Aldomet as close to 12 hr intervals as possible (i.e. 8 & 2000, 9& 2100, etc), but be consistent.     Genetta Fiero H 03/24/2012, 2:04 PM

## 2012-03-26 ENCOUNTER — Inpatient Hospital Stay (HOSPITAL_COMMUNITY)
Admission: AD | Admit: 2012-03-26 | Discharge: 2012-03-26 | Disposition: A | Discharge status: Home / Self Care | Payer: BC Managed Care – PPO | Source: Ambulatory Visit | Attending: Obstetrics and Gynecology | Admitting: Obstetrics and Gynecology

## 2012-03-26 ENCOUNTER — Encounter (HOSPITAL_COMMUNITY): Payer: Self-pay | Admitting: Family

## 2012-03-26 DIAGNOSIS — O99210 Obesity complicating pregnancy, unspecified trimester: Secondary | ICD-10-CM | POA: Insufficient documentation

## 2012-03-26 DIAGNOSIS — O139 Gestational [pregnancy-induced] hypertension without significant proteinuria, unspecified trimester: Secondary | ICD-10-CM | POA: Insufficient documentation

## 2012-03-26 DIAGNOSIS — O99019 Anemia complicating pregnancy, unspecified trimester: Secondary | ICD-10-CM | POA: Insufficient documentation

## 2012-03-26 DIAGNOSIS — E669 Obesity, unspecified: Secondary | ICD-10-CM | POA: Insufficient documentation

## 2012-03-26 DIAGNOSIS — D649 Anemia, unspecified: Secondary | ICD-10-CM | POA: Insufficient documentation

## 2012-03-26 DIAGNOSIS — O10019 Pre-existing essential hypertension complicating pregnancy, unspecified trimester: Secondary | ICD-10-CM

## 2012-03-26 HISTORY — DX: Anxiety disorder, unspecified: F41.9

## 2012-03-26 LAB — PROTEIN, URINE, 24 HOUR
Protein, 24H Urine: 540 mg/d — ABNORMAL HIGH (ref 50–100)
Protein, Urine: 30 mg/dL

## 2012-03-26 LAB — CREATININE CLEARANCE, URINE, 24 HOUR
Collection Interval-CRCL: 24 hours
Creatinine, Urine: 117.51 mg/dL
Creatinine: 0.69 mg/dL (ref 0.50–1.10)
Urine Total Volume-CRCL: 1800 mL

## 2012-03-26 NOTE — MAU Note (Signed)
Patient verbalized understanding of increase of Aldomet from 250 mg to 500 mg twice daily per Dr. Stefano Gaul.

## 2012-03-26 NOTE — Consult Note (Addendum)
MAU History and Physical Exam for an Obstetrics Patient  Dawn Dawson is a 27 y.o. female, G1P0, at [redacted]w[redacted]d gestation, who presents for continued management of pregnancy-induced hypertension. She has been followed at the [facility] division of [facility].  Her pregnancy has been complicated by hypertension for which she takes Aldomet 250 mg twice each day. She is bringing in a 24-hour urine protein sample. She denies headaches, blurred vision, and right upper quadrant tenderness. See history below.  OB History    Grav Para Term Preterm Abortions TAB SAB Ect Mult Living   1               Past Medical History  Diagnosis Date  . Hypertension 2012    WAS TAKING ALDOMET  . Obese 10/04/2011    pregravid BMI=48  . HTN (hypertension)     Stopped Aldomet w/ positive UPT  . Urinary tract infection   . Heart murmur     as child  . Anxiety     Prescriptions prior to admission  Medication Sig Dispense Refill  . calcium elemental as carbonate (TUMS ULTRA 1000) 400 MG tablet Chew 1,000 mg by mouth as needed.      . methyldopa (ALDOMET) 250 MG tablet Take 1 tablet (250 mg total) by mouth 2 (two) times daily.  60 tablet  6  . Prenatal Vit w/Fe-Methylfol-FA (PNV PO) Take by mouth daily.      . ranitidine (ZANTAC) 150 MG tablet Take 150 mg by mouth as needed.      . sertraline (ZOLOFT) 50 MG tablet Take 1 tablet (50 mg total) by mouth daily.  30 tablet  6    Past Surgical History  Procedure Date  . Wisdom tooth extraction     ALL 4 EXTRACTED  . Cholecystectomy open 2007    NO COMPLICATIONS    No Known Allergies  Family History: family history includes Emphysema in her cousin and maternal uncle; Hypertension in her father; Kidney disease in her maternal grandmother; and Seizures in her maternal uncle.  There is no history of Other.  Social History:  reports that she has never smoked. She has never used smokeless tobacco. She reports that  she does not drink alcohol or use illicit drugs.  Review of systems: Normal pregnancy complaints.  Admission Physical Exam:    Body mass index is 57.03 kg/(m^2).  Blood pressure 137/76, pulse 97, temperature 97.1 F (36.2 C), temperature source Oral, resp. rate 18, height 5\' 2"  (1.575 m), weight 311 lb 12.8 oz (141.432 kg), SpO2 100.00%.  BP: 151/98 initially  HEENT:                 Within normal limits Chest:                   Clear Heart:                    Regular rate and rhythm Abdomen:             Gravid and nontender Extremities:          Grossly normal Neurologic exam: Grossly normal Pelvic exam:         Cervix: Not checked  Prenatal labs: ABO, Rh:             A/POS/-- (06/26 1557) Antibody:              NEG (06/26 1557) Rubella:  Immune RPR:                    NON REAC (11/25 1422)  HBsAg:                 NEGATIVE (06/26 1557)  HIV:                       NON REACTIVE (06/26 1557)  GBS:                       Pending  NST: Cat 1  CBC    Component Value Date/Time   WBC 12.3* 03/24/2012 1245   RBC 3.63* 03/24/2012 1245   HGB 9.5* 03/24/2012 1245   HCT 29.3* 03/24/2012 1245   PLT 314 03/24/2012 1245   MCV 80.7 03/24/2012 1245   MCH 26.2 03/24/2012 1245   MCHC 32.4 03/24/2012 1245   RDW 14.3 03/24/2012 1245   LYMPHSABS 2.4 09/16/2011 1557   MONOABS 0.7 09/16/2011 1557   EOSABS 0.1 09/16/2011 1557   BASOSABS 0.0 09/16/2011 1557    CMP     Component Value Date/Time   NA 134* 03/24/2012 1245   K 4.0 03/24/2012 1245   CL 102 03/24/2012 1245   CO2 20 03/24/2012 1245   GLUCOSE 81 03/24/2012 1245   BUN 8 03/24/2012 1245   CREATININE 0.69 03/25/2012 1330   CREATININE 0.69 03/24/2012 1245   CALCIUM 9.1 03/24/2012 1245   PROT 6.3 03/24/2012 1245   ALBUMIN 2.1* 03/24/2012 1245   AST 20 03/24/2012 1245   ALT 14 03/24/2012 1245   ALKPHOS 192* 03/24/2012 1245   BILITOT 0.3 03/24/2012 1245   GFRNONAA >90 03/24/2012 1245   GFRAA >90 03/24/2012 1245      Assessment:  [redacted]w[redacted]d  gestation  Hypertension  Obesity  Rule out preeclampsia  Anemia  Plan:  Increase Aldomet to 500 mg twice each day.  We will check the results of her 24-hour urine protein sample. We will call patient if positive.  Return to office in one week for antenatal testing.   Janine Limbo 03/26/2012, 2:43 PM

## 2012-03-26 NOTE — MAU Note (Signed)
Pt here to drop off 24 hr urine and check B/P. Pt denies H/A or discomfort except c/o a sore throat and nasal congestion

## 2012-03-26 NOTE — MAU Note (Addendum)
Patient presents to MAU to return 24-hr urine. Reports good fetal movement and denies vaginal bleeding/discharge/cramping.  Patient reports she has a sore throat x 1 day; denies headache.

## 2012-03-28 ENCOUNTER — Observation Stay (HOSPITAL_COMMUNITY)
Admission: AD | Admit: 2012-03-28 | Discharge: 2012-03-29 | Disposition: A | Discharge status: Home / Self Care | Payer: BC Managed Care – PPO | Source: Ambulatory Visit | Attending: Obstetrics and Gynecology | Admitting: Obstetrics and Gynecology

## 2012-03-28 ENCOUNTER — Telehealth: Payer: Self-pay | Admitting: Obstetrics and Gynecology

## 2012-03-28 ENCOUNTER — Encounter (HOSPITAL_COMMUNITY): Payer: Self-pay | Admitting: *Deleted

## 2012-03-28 DIAGNOSIS — O139 Gestational [pregnancy-induced] hypertension without significant proteinuria, unspecified trimester: Secondary | ICD-10-CM

## 2012-03-28 DIAGNOSIS — O99891 Other specified diseases and conditions complicating pregnancy: Secondary | ICD-10-CM | POA: Insufficient documentation

## 2012-03-28 DIAGNOSIS — F419 Anxiety disorder, unspecified: Secondary | ICD-10-CM

## 2012-03-28 DIAGNOSIS — O99013 Anemia complicating pregnancy, third trimester: Secondary | ICD-10-CM | POA: Diagnosis present

## 2012-03-28 DIAGNOSIS — I10 Essential (primary) hypertension: Secondary | ICD-10-CM | POA: Diagnosis present

## 2012-03-28 DIAGNOSIS — O99019 Anemia complicating pregnancy, unspecified trimester: Secondary | ICD-10-CM | POA: Insufficient documentation

## 2012-03-28 DIAGNOSIS — Z22330 Carrier of Group B streptococcus: Secondary | ICD-10-CM

## 2012-03-28 DIAGNOSIS — O99210 Obesity complicating pregnancy, unspecified trimester: Secondary | ICD-10-CM | POA: Insufficient documentation

## 2012-03-28 DIAGNOSIS — D649 Anemia, unspecified: Secondary | ICD-10-CM | POA: Insufficient documentation

## 2012-03-28 DIAGNOSIS — IMO0002 Reserved for concepts with insufficient information to code with codable children: Principal | ICD-10-CM | POA: Insufficient documentation

## 2012-03-28 DIAGNOSIS — O10019 Pre-existing essential hypertension complicating pregnancy, unspecified trimester: Secondary | ICD-10-CM

## 2012-03-28 DIAGNOSIS — O26839 Pregnancy related renal disease, unspecified trimester: Secondary | ICD-10-CM

## 2012-03-28 DIAGNOSIS — E669 Obesity, unspecified: Secondary | ICD-10-CM | POA: Diagnosis present

## 2012-03-28 LAB — CBC
HCT: 30.8 % — ABNORMAL LOW (ref 36.0–46.0)
MCH: 25.7 pg — ABNORMAL LOW (ref 26.0–34.0)
MCHC: 32.1 g/dL (ref 30.0–36.0)
MCV: 80 fL (ref 78.0–100.0)
Platelets: 337 10*3/uL (ref 150–400)
RDW: 14.3 % (ref 11.5–15.5)

## 2012-03-28 LAB — BASIC METABOLIC PANEL
BUN: 9 mg/dL (ref 6–23)
Calcium: 9.4 mg/dL (ref 8.4–10.5)
Chloride: 101 mEq/L (ref 96–112)
Creatinine, Ser: 0.6 mg/dL (ref 0.50–1.10)
GFR calc Af Amer: >90 mL/min (ref >90)

## 2012-03-28 LAB — LACTATE DEHYDROGENASE: LDH: 167 U/L (ref 94–250)

## 2012-03-28 LAB — URINALYSIS, ROUTINE W REFLEX MICROSCOPIC
Bilirubin Urine: NEGATIVE
Glucose, UA: NEGATIVE mg/dL
Protein, ur: NEGATIVE mg/dL
Urobilinogen, UA: 1 mg/dL (ref 0.0–1.0)

## 2012-03-28 LAB — AST: AST: 28 U/L (ref 0–37)

## 2012-03-28 LAB — URINE MICROSCOPIC-ADD ON

## 2012-03-28 LAB — ALT: ALT: 19 U/L (ref 0–35)

## 2012-03-28 MED ORDER — PRENATAL MULTIVITAMIN CH
1.0000 | ORAL_TABLET | Freq: Every day | ORAL | Status: DC
  Administered 2012-03-29: 1 via ORAL
  Filled 2012-03-28: qty 1

## 2012-03-28 MED ORDER — METHYLDOPA 250 MG PO TABS
250.0000 mg | ORAL_TABLET | Freq: Two times a day (BID) | ORAL | Status: DC
  Administered 2012-03-28 – 2012-03-29 (×2): 250 mg via ORAL
  Filled 2012-03-28 (×4): qty 1

## 2012-03-28 MED ORDER — FAMOTIDINE 20 MG PO TABS
20.0000 mg | ORAL_TABLET | Freq: Two times a day (BID) | ORAL | Status: DC
  Administered 2012-03-28 – 2012-03-29 (×2): 20 mg via ORAL
  Filled 2012-03-28 (×2): qty 1

## 2012-03-28 MED ORDER — GUAIFENESIN 100 MG/5ML PO SOLN
5.0000 mL | ORAL | Status: DC | PRN
  Administered 2012-03-28 – 2012-03-29 (×2): 100 mg via ORAL
  Filled 2012-03-28 (×2): qty 15

## 2012-03-28 MED ORDER — DOCUSATE SODIUM 100 MG PO CAPS
100.0000 mg | ORAL_CAPSULE | Freq: Every day | ORAL | Status: DC
  Administered 2012-03-29: 100 mg via ORAL
  Filled 2012-03-28: qty 1

## 2012-03-28 MED ORDER — ACETAMINOPHEN 325 MG PO TABS
650.0000 mg | ORAL_TABLET | ORAL | Status: DC | PRN
  Administered 2012-03-28 – 2012-03-29 (×3): 650 mg via ORAL
  Filled 2012-03-28 (×3): qty 2

## 2012-03-28 MED ORDER — SERTRALINE HCL 50 MG PO TABS
50.0000 mg | ORAL_TABLET | Freq: Every day | ORAL | Status: DC
  Administered 2012-03-29: 50 mg via ORAL
  Filled 2012-03-28 (×2): qty 1

## 2012-03-28 MED ORDER — CALCIUM CARBONATE ANTACID 500 MG PO CHEW: 2.0000 | CHEWABLE_TABLET | ORAL | Status: DC | PRN

## 2012-03-28 MED ORDER — ZOLPIDEM TARTRATE 5 MG PO TABS: 5.0000 mg | ORAL_TABLET | Freq: Every evening | ORAL | Status: DC | PRN

## 2012-03-28 NOTE — Telephone Encounter (Signed)
Patient called. She was told that urine protein is elevated. She will come to the [facility] hospital for evaluation. She understands that delivery may be needed. Preeclampsia discussed. Her husband will drive.   Dr. Stefano Gaul

## 2012-03-28 NOTE — H&P (Signed)
Dawn Dawson is a 27 y.o. female presenting for evaluation to r/o pre-eclampsia. She denies HA/N/V or RUQ pain, no blurry vision. She has pedal edema which she reports is improved today. She admits to only drinking a few glasses of water today. Last ate at 10pm.   HPI: pt began [facility] at 7wks  [facility] based on 7wk [place] =05/03/12 Pt had been on aldomet for CHTN, which she dc'd w +UPT BP remained stable until about 24wks and she was then restarted on aldomet 250mg  BID She also struggled w anxiety and panic attacks and was started on zoloft 50mg , which did improve her sx's Early 1hr gtt was normal Anatomy US at 19wks was incomplete due to body habitus, f/u at 24wks was WNL She's had normal antenatal testing, w normal serial fetal growth and BPP's Most recent EFW at [redacted]w[redacted]d =4#15oz - 64% and AFI 65% - BPP 8/8 [place] at [redacted]w[redacted]d - BPP 8/8 and AFI normal  BP had remained stable on aldomet and then was noted to be elevated at most recent visit in office on 1/3 She was sent to MAU for observation  PIH labs on 1/3 were WNL On 1/3 - 24hr urine had 540mg  protein  TWG = 43# - 1# wgt gain in last 2 days   Maternal Medical History:  Reason for admission: Reason for Admission:   nauseaObservation for hypertension, r/o pre-eclampsia   Contractions: denies  Fetal activity: Perceived fetal activity is normal.   Last perceived fetal movement was within the past hour.    Prenatal complications: Hypertension.     OB History    Grav Para Term Preterm Abortions TAB SAB Ect Mult Living   1              Past Medical History  Diagnosis Date  . Hypertension 2012    WAS TAKING ALDOMET  . Obese 10/04/2011    pregravid BMI=48  . HTN (hypertension)     Stopped Aldomet w/ positive UPT  . Urinary tract infection   . Heart murmur     as child  . Anxiety    Past Surgical History  Procedure Date  . Wisdom tooth extraction     ALL 4 EXTRACTED  . Cholecystectomy open 2007    NO COMPLICATIONS   Family  History: family history includes Emphysema in her cousin and maternal uncle; Hypertension in her father; Kidney disease in her maternal grandmother; and Seizures in her maternal uncle.  There is no history of Other. Social History:  reports that she has never smoked. She has never used smokeless tobacco. She reports that she does not drink alcohol or use illicit drugs.   Prenatal Transfer Tool  Maternal Diabetes: No Genetic Screening: Declined Maternal Ultrasounds/Referrals: Normal Fetal Ultrasounds or other Referrals:  None Maternal Substance Abuse:  No Significant Maternal Medications:  Meds include: Zantac Zoloft, aldomet  Significant Maternal Lab Results:  Lab values include: Other:  24hour urine protein =540mg   Other Comments:  chronic hypertension   Review of Systems  Constitutional: Negative for fever.  HENT: Positive for congestion. Negative for sore throat.   Eyes: Negative for blurred vision and double vision.  Respiratory: Positive for cough. Negative for shortness of breath.   Cardiovascular: Positive for leg swelling. Negative for chest pain.  Gastrointestinal: Positive for heartburn. Negative for nausea, vomiting and abdominal pain.  Musculoskeletal: Positive for back pain.  Neurological: Positive for headaches.  All other systems reviewed and are negative.  Blood pressure 138/72, pulse 113, temperature 98.2 F (36.8 C), temperature source Oral, resp. rate 18.  Filed Vitals:   03/28/12 2100 03/28/12 2136 03/28/12 2156 03/28/12 2250  BP: 132/94 140/90 138/72   Pulse:      Temp:      TempSrc:      Resp:      Height:    5\' 3"  (1.6 m)  Weight:    312 lb (141.522 kg)      Maternal Exam:  Abdomen: Fundal height is aga.   Estimated fetal weight is 5-6#.   Fetal presentation: vertex  Introitus: not evaluated.   Cervix: not evaluated.   Fetal Exam Fetal Monitor Review: Mode: ultrasound.   Variability: moderate (6-25 bpm).   Pattern: accelerations  present and no decelerations.    Fetal State Assessment: Category I - tracings are normal.     Physical Exam  Nursing note and vitals reviewed. Constitutional: She is oriented to person, place, and time. She appears well-developed and well-nourished.  HENT:  Head: Normocephalic.  Eyes: Pupils are equal, round, and reactive to light.  Neck: Normal range of motion.  Cardiovascular: Normal rate, regular rhythm and normal heart sounds.   Respiratory: Effort normal and breath sounds normal. She has no wheezes. She has no rales.  GI: Soft. Bowel sounds are normal. There is no tenderness.  Genitourinary:       deferred  Musculoskeletal: Normal range of motion. She exhibits edema and tenderness.       Bilateral pedal edema, non-pitting 1+ Mild LEE 1+   Neurological: She is alert and oriented to person, place, and time. She has normal reflexes.       Neg clonus   Skin: Skin is warm and dry.  Psychiatric: She has a normal mood and affect. Her behavior is normal.    Prenatal labs: ABO, Rh: A/POS/-- (06/26 1557) Antibody: NEG (06/26 1557) Rubella: 32.7 (06/26 1557) RPR: NON REAC (11/25 1422)  HBsAg: NEGATIVE (06/26 1557)  HIV: NON REACTIVE (06/26 1557)  GBS:   not collected  No pap or cx done this pregnancy  Neg Sickle cell testing  hgb at NOB =12.3 hgb at 28wks -10.9, RPR NR Early 1hr gtt =88, repeat 1hr gtt at 28wks =119    Assessment/Plan: IUP at [redacted]w[redacted]d Chronic hypertension on aldomet R/o superimposed pre-eclampsia BP stable No sx's pre-eclampsia at present  24hr urine specimen on 1/3 = 540mg  protein PIH labs and UA today neg FHR reassuring  Admit to antenatal per c/w Dr Estanislado Pandy CEFM Continue aldomet 250mg  BID Monitor BP's  MFM consult in the AM    Azhane Eckart M 03/28/2012, 10:40 PM

## 2012-03-29 ENCOUNTER — Encounter: Payer: Self-pay | Admitting: Obstetrics and Gynecology

## 2012-03-29 ENCOUNTER — Ambulatory Visit (HOSPITAL_COMMUNITY): Payer: BC Managed Care – PPO

## 2012-03-29 ENCOUNTER — Observation Stay (HOSPITAL_COMMUNITY): Payer: BC Managed Care – PPO

## 2012-03-29 DIAGNOSIS — O10019 Pre-existing essential hypertension complicating pregnancy, unspecified trimester: Secondary | ICD-10-CM

## 2012-03-29 DIAGNOSIS — IMO0002 Reserved for concepts with insufficient information to code with codable children: Secondary | ICD-10-CM

## 2012-03-29 LAB — COMPREHENSIVE METABOLIC PANEL
ALT: 19 U/L (ref 0–35)
AST: 28 U/L (ref 0–37)
Alkaline Phosphatase: 200 U/L — ABNORMAL HIGH (ref 39–117)
CO2: 21 mEq/L (ref 19–32)
Calcium: 9.2 mg/dL (ref 8.4–10.5)
GFR calc Af Amer: >90 mL/min (ref >90)
GFR calc non Af Amer: >90 mL/min (ref >90)
Glucose, Bld: 87 mg/dL (ref 70–99)
Potassium: 4 mEq/L (ref 3.5–5.1)
Sodium: 133 mEq/L — ABNORMAL LOW (ref 135–145)
Total Protein: 6.4 g/dL (ref 6.0–8.3)

## 2012-03-29 LAB — CBC
Hemoglobin: 9.2 g/dL — ABNORMAL LOW (ref 12.0–15.0)
MCH: 25.6 pg — ABNORMAL LOW (ref 26.0–34.0)
Platelets: 306 10*3/uL (ref 150–400)
RBC: 3.59 MIL/uL — ABNORMAL LOW (ref 3.87–5.11)

## 2012-03-29 LAB — ABO/RH: ABO/RH(D): A POS

## 2012-03-29 MED ORDER — GUAIFENESIN 100 MG/5ML PO SOLN
15.0000 mL | ORAL | Status: DC | PRN
  Administered 2012-03-29: 300 mg via ORAL

## 2012-03-29 NOTE — Consult Note (Signed)
MFM Consultation, Staff Note:  RE: preeclampsia  HPI:  Dawn Dawson  had an ultrasound and MFM consultation appointment today at 35 weeks with HTN and mild preeclampsia by mild range HTN and mild proteinuria.  Please see AS-OB/GYN report for details.  OB History    Grav Para Term Preterm Abortions TAB SAB Ect Mult Living   1               Past Medical History  Diagnosis Date  . Obese 10/04/2011    pregravid BMI=48  . Urinary tract infection   . Heart murmur     as child  . Anxiety   . Hypertension 2012    WAS TAKING ALDOMET  . HTN (hypertension)     Stopped Aldomet w/ positive UPT then restarted  Oct. 2013    Past Surgical History  Procedure Date  . Wisdom tooth extraction     ALL 4 EXTRACTED  . Cholecystectomy open 2007    NO COMPLICATIONS     No current facility-administered medications on file prior to encounter.   Current Outpatient Prescriptions on File Prior to Encounter  Medication Sig Dispense Refill  . acetaminophen (TYLENOL) 500 MG tablet Take 500 mg by mouth every 6 (six) hours as needed. For pain      . calcium elemental as carbonate (TUMS ULTRA 1000) 400 MG tablet Chew 1,000 mg by mouth as needed.      . methyldopa (ALDOMET) 250 MG tablet Take 1 tablet (250 mg total) by mouth 2 (two) times daily.  60 tablet  6  . Prenatal Vit w/Fe-Methylfol-FA (PNV PO) Take by mouth daily.      . ranitidine (ZANTAC) 150 MG tablet Take 150 mg by mouth as needed.      . sertraline (ZOLOFT) 50 MG tablet Take 1 tablet (50 mg total) by mouth daily.  30 tablet  6    No Known Allergies  History   Social History  . Marital Status: Married    Spouse Name: LOAN OGUIN    Number of Children: 0  . Years of Education: 16   Occupational History  . LPN    Social History Main Topics  . Smoking status: Never Smoker   . Smokeless tobacco: Never Used  . Alcohol Use: No     Comment: SOCIAL DRINKER ONLY  . Drug Use: No  . Sexually Active: Yes -- Female partner(s)    Birth  Control/ Protection: None     Comment: currently pregnant   Other Topics Concern  . Not on file   Social History Narrative  . No narrative on file   Discussion:  By way of consultation, I spoke to Dawn Dawson about the risks of hypertension with mild preeclampsia in pregnancy.  I reviewed hypertension as a cause of uteroplacental insufficiency, with increased risk of IUGR, oligohydramnios, and stillbirth.  I told her that her about her diagnosis of mild preeclampsia, describing the triad of increased blood pressure, proteinuria, and abnormal edema.  Finally, I spoke about hypertension increasing the risk of placental abruption, especially in the setting of superimposed preeclampsia.  We talked about the medical treatment of hypertension in pregnancy.  I outlined the different classes of medications, emphasizing that angiotensin enzyme inhibitors and angoitensin receptor blockers are contraindicated, and diuretics are relatively contraindicated.  I told her that beta-blockers and calcium channel blockers are commonly used to treat hypertension in pregnancy, and that both are felt to be safe for use in  pregnancy.  Finally, I outlined the usual plan of management for hypertension in pregnancy.  She should have her blood pressure carefully followed, and her medications adjusted to keep her BP in the target range of around 130-140/80-90 mm/Hg.   I reviewed her 24-hour urine collection for total protein and creatinine clearance as demonstrating mild proteinuria; given her lack of clinical signs and symptoms of severe preeclampsia, she should be managed as having mild preeclampsia.  Given her condition, I recommend twice weekly non-stress tests with weekly amniotic fluid volume measurement should be initiated.  I recommend weekly preeclamptic labs and physical examination in your office to screen for severe disease.  I counseled her on fetal kick counts and symptoms of severe preeclampsia for  which she should present for urgent evaluation.   Finally, assuming all goes well, she should be delivered by [redacted] weeks gestation, if spontaneous labor or severe preeclampsia does not occur before.    Impression Active singleton fetus at 18 0/7 weeks in gestation complicated by [facility] with superimposed preeclampsia The biometry suggests a fetus with an EFW at the approximately 90th percentile for gestational age.  No apparent dysmorphic features on today's routine anatomic examination.  Normal amniotic fluid volume BPP 8/8   Recommendations 1. NST twice weekly with BP measurement 2. Weekly AFI 3. Fetal kick counts 4. Weekly prenatal visit with exam and preeclampsia labs 5. Delivery by 37 weeks provided fetal testing remains reassuring and there remains no evidence of evolving severe preeclampsia.   I spent in excess of 60 minutes in review of medical records, evaluation, and education of your patient in consultation.  More than 50% of this time was spent in direct face-to-face counseling.  It was a pleasure seeing your patient today in consultation.  Thank you for allowing [place] the opportunity to contribute to the care of your patient.  Page with questions.  Merideth Abbey, MD, MS, FACOG Assistant Professor, Maternal-Fetal Medicine

## 2012-03-29 NOTE — Progress Notes (Signed)
Pt returning from MFM

## 2012-03-29 NOTE — Progress Notes (Signed)
Patient ID: Dawn Dawson, female   DOB: Nov 02, 1985, 27 y.o.   MRN: [id number] Dawn Dawson is a 27 y.o. G1P0 at [redacted]w[redacted]d,  by LMP admitted for rule out preeclampsia  Subjective: GI: negative GU: Denies: dysuria, frequency/urgency, hematuria, genital discharge, vaginal bleeding, pelvic pain OB: Good fetal movement.  Denies headache or upper abdominal pain        Objective: BP 147/83  Pulse 107  Temp 97.6 F (36.4 C) (Oral)  Resp 20  Ht 5\' 3"  (1.6 m)  Wt 312 lb (141.522 kg)  BMI 55.27 kg/m2 I/O last 3 completed shifts: In: 60 [P.O.:60] Out: 675 [Urine:675]    FHT:  FHR: 130s bpm, variability: moderate,  accelerations:  Present,  decelerations:  Absent UC:   none SVE:   Dilation: Closed Effacement (%): Thick Station:  (high) Exam by:: Dr. Pennie Rushing  Labs: Lab Results  Component Value Date   WBC 9.1 03/29/2012   HGB 9.2* 03/29/2012   HCT 28.9* 03/29/2012   MCV 80.5 03/29/2012   PLT 306 03/29/2012    Assessment  Pregnancy at 35 weeks Fetal Wellbeing:  Category I Chronic Continue hypertension, well controlled R/O superimposed pre-eclampsia  Plan Continue Aldomet Maternal fetal medicine consultation      Daneka Lantigua P 03/29/2012, 11:15 AM

## 2012-03-29 NOTE — Discharge Summary (Signed)
  Physician Discharge Summary  Patient ID: Dawn Dawson MRN: [id number] DOB/AGE: 1985-05-07 27 y.o.  Admit date: 03/28/2012 Discharge date: 03/29/2012  Admission Diagnoses: R/o pre-eclampsia GBS carrier  Obese  HTN (hypertension)  Anxiety  Anemia complicating pregnancy in third trimester   Discharge Diagnoses:  Mild pre eclampsia  GBS carrier  Obese  HTN (hypertension)  Anxiety  Anemia complicating pregnancy in third trimester   Discharged Condition: stable  Hospital Course: The patient was admitted to the antenatal unit because of proteinuria.  Her blood pressures stabilized in the 130 to 140/75-90 range.  She denied headaches or upper abdominal pain. Her laboratory studies were normal. She underwent a maternal fetal medicine consultation. Her are fetal heart rate tracing was reassuring.  Her biophysical profile was 8 over 8 with normal fluid.   After discussion of her management with the consultant. she was discharged home with followup as noted below.  Consults: Maternal Fetal Medicine  Recommendations  1. NST twice weekly with BP measurement  2. Weekly AFI  3. Fetal kick counts  4. Weekly prenatal visit with exam and preeclampsia labs  5. Delivery by 37 weeks provided fetal testing remains reassuring and there remains no evidence of evolving severe preeclampsia  Significant Diagnostic Studies: labs: Ultrasound and CBC and comprehensive metabolic panel. Nonstress testing and biophysical profile  Treatments: Prior to admission. Medications only  Discharge Exam: Blood pressure 143/75, pulse 115, temperature 98.4 F (36.9 C), temperature source Oral, resp. rate 20, height 5\' 3"  (1.6 m), weight 312 lb (141.522 kg). General appearance: alert, cooperative, appears stated age, no distress and morbidly obese Cervix: Long and closed with a ballotable vertex  Disposition: 01-Home or Self Care      Discharge Orders    Future Appointments: Provider: Department: Dept Phone:  Center:   04/01/2012 11:30 AM [facility] [phone] None   04/05/2012 11:00 AM [facility] [place] [facility] [phone] None   04/05/2012 11:30 AM Hal Morales, MD [facility] [phone] None       Medication List     As of 03/29/2012  3:14 PM    TAKE these medications         acetaminophen 500 MG tablet   Commonly known as: TYLENOL   Take 500 mg by mouth every 6 (six) hours as needed. For pain      methyldopa 250 MG tablet   Commonly known as: ALDOMET   Take 1 tablet (250 mg total) by mouth 2 (two) times daily.      PNV PO   Take by mouth daily.      sertraline 50 MG tablet   Commonly known as: ZOLOFT   Take 1 tablet (50 mg total) by mouth daily.      TUMS ULTRA 1000 400 MG tablet   Generic drug: calcium elemental as carbonate   Chew 1,000 mg by mouth as needed.      ZANTAC 150 MG tablet   Generic drug: ranitidine   Take 150 mg by mouth as needed.        Follow-up Information    Follow up with [facility]. On 04/01/2012. (bpp @ 1130; 04/06/11 @11am  for BPP and visit)    Contact information:   [address] [place] [address] [phone]         Signed: Hal Morales 03/29/2012, 3:14 PM

## 2012-03-30 LAB — TYPE AND SCREEN
Antibody Screen: NEGATIVE
Unit division: 0

## 2012-03-31 ENCOUNTER — Other Ambulatory Visit: Payer: BC Managed Care – PPO

## 2012-03-31 ENCOUNTER — Encounter: Payer: BC Managed Care – PPO | Admitting: Obstetrics and Gynecology

## 2012-04-01 ENCOUNTER — Ambulatory Visit (INDEPENDENT_AMBULATORY_CARE_PROVIDER_SITE_OTHER): Payer: BC Managed Care – PPO

## 2012-04-01 ENCOUNTER — Other Ambulatory Visit: Payer: Self-pay

## 2012-04-01 VITALS — BP 132/80

## 2012-04-01 DIAGNOSIS — F419 Anxiety disorder, unspecified: Secondary | ICD-10-CM

## 2012-04-01 DIAGNOSIS — O10019 Pre-existing essential hypertension complicating pregnancy, unspecified trimester: Secondary | ICD-10-CM

## 2012-04-01 DIAGNOSIS — Z22330 Carrier of Group B streptococcus: Secondary | ICD-10-CM

## 2012-04-01 DIAGNOSIS — Z3400 Encounter for supervision of normal first pregnancy, unspecified trimester: Secondary | ICD-10-CM

## 2012-04-05 ENCOUNTER — Ambulatory Visit: Payer: BC Managed Care – PPO

## 2012-04-05 ENCOUNTER — Telehealth: Payer: Self-pay | Admitting: Obstetrics and Gynecology

## 2012-04-05 ENCOUNTER — Encounter: Payer: Self-pay | Admitting: Obstetrics and Gynecology

## 2012-04-05 ENCOUNTER — Ambulatory Visit: Payer: BC Managed Care – PPO | Admitting: Obstetrics and Gynecology

## 2012-04-05 VITALS — BP 144/84 | Wt 316.0 lb

## 2012-04-05 DIAGNOSIS — I10 Essential (primary) hypertension: Secondary | ICD-10-CM

## 2012-04-05 DIAGNOSIS — IMO0002 Reserved for concepts with insufficient information to code with codable children: Secondary | ICD-10-CM

## 2012-04-05 DIAGNOSIS — Z22330 Carrier of Group B streptococcus: Secondary | ICD-10-CM

## 2012-04-05 DIAGNOSIS — Z331 Pregnant state, incidental: Secondary | ICD-10-CM

## 2012-04-05 DIAGNOSIS — E669 Obesity, unspecified: Secondary | ICD-10-CM

## 2012-04-05 DIAGNOSIS — O99013 Anemia complicating pregnancy, third trimester: Secondary | ICD-10-CM

## 2012-04-05 DIAGNOSIS — Z3400 Encounter for supervision of normal first pregnancy, unspecified trimester: Secondary | ICD-10-CM

## 2012-04-05 DIAGNOSIS — F419 Anxiety disorder, unspecified: Secondary | ICD-10-CM

## 2012-04-05 LAB — COMPREHENSIVE METABOLIC PANEL
BUN: 9 mg/dL (ref 6–23)
CO2: 20 mEq/L (ref 19–32)
Creat: 0.65 mg/dL (ref 0.50–1.10)
Glucose, Bld: 116 mg/dL — ABNORMAL HIGH (ref 70–99)
Total Bilirubin: 0.4 mg/dL (ref 0.3–1.2)
Total Protein: 6.7 g/dL (ref 6.0–8.3)

## 2012-04-05 LAB — URIC ACID: Uric Acid, Serum: 3.6 mg/dL (ref 2.4–7.0)

## 2012-04-05 LAB — CBC
HCT: 30.7 % — ABNORMAL LOW (ref 36.0–46.0)
Hemoglobin: 9.9 g/dL — ABNORMAL LOW (ref 12.0–15.0)
RBC: 3.9 MIL/uL (ref 3.87–5.11)

## 2012-04-05 LAB — LACTATE DEHYDROGENASE: LDH: 194 U/L (ref 94–250)

## 2012-04-05 NOTE — Progress Notes (Signed)
[redacted]w[redacted]d Mild superimposed pre-eclampsia L arm after 5 mins 150/98 BPP - CHTN Ultrasound shows:             BPP: 8 of 8 in 12 mins           AFI: normal fluid 18.4cm = 70th%tile           Cervical length: not measured            Placenta localization: posterior           Fetal presentation: vertex   Single fetus Technically difficult study due to maternal habitus and advanced GA. Hospitalized last week for mild pre-eclampsia. Seen by MFM with the following recommendations made:  Recommendations  1. NST twice weekly with BP measurement  2. Weekly AFI  3. Fetal kick counts  4. Weekly prenatal visit with exam and preeclampsia labs  5. Delivery by 37 weeks provided fetal testing remains reassuring and there remains no evidence of evolving severe preeclampsia.   PLAN CMP, CBC, Uric acid today.  If abnormal, induce now.  If nl: BPP with AFI and  blood pressure on 04/08/12.  If  All nl: Induction on 04/11/12 pm for cervical ripening.

## 2012-04-05 NOTE — Telephone Encounter (Signed)
Patient called. Told it PIH labs are normal. She denies headaches and blurred vision.  Return to office on 04/08/2012 for BPP and blood pressure check.  Call for problems.  CBC    Component Value Date/Time   WBC 10.2 04/05/2012 1331   RBC 3.90 04/05/2012 1331   HGB 9.9* 04/05/2012 1331   HCT 30.7* 04/05/2012 1331   PLT 352 04/05/2012 1331   MCV 78.7 04/05/2012 1331   MCH 25.4* 04/05/2012 1331   MCHC 32.2 04/05/2012 1331   RDW 14.4 04/05/2012 1331   LYMPHSABS 2.4 09/16/2011 1557   MONOABS 0.7 09/16/2011 1557   EOSABS 0.1 09/16/2011 1557   BASOSABS 0.0 09/16/2011 1557    CMP     Component Value Date/Time   NA 136 04/05/2012 1331   K 3.9 04/05/2012 1331   CL 102 04/05/2012 1331   CO2 20 04/05/2012 1331   GLUCOSE 116* 04/05/2012 1331   BUN 9 04/05/2012 1331   CREATININE 0.65 04/05/2012 1331   CREATININE 0.66 03/29/2012 0815   CREATININE 0.69 03/25/2012 1330   CALCIUM 9.4 04/05/2012 1331   PROT 6.7 04/05/2012 1331   ALBUMIN 2.2* 04/05/2012 1331   AST 26 04/05/2012 1331   ALT 20 04/05/2012 1331   ALKPHOS 234* 04/05/2012 1331   BILITOT 0.4 04/05/2012 1331   GFRNONAA >90 03/29/2012 0815   GFRAA >90 03/29/2012 0815    Dr. Stefano Gaul

## 2012-04-06 ENCOUNTER — Encounter (HOSPITAL_COMMUNITY): Payer: Self-pay | Admitting: *Deleted

## 2012-04-06 ENCOUNTER — Telehealth: Payer: Self-pay | Admitting: Obstetrics and Gynecology

## 2012-04-06 ENCOUNTER — Telehealth (HOSPITAL_COMMUNITY): Payer: Self-pay | Admitting: *Deleted

## 2012-04-06 NOTE — Telephone Encounter (Signed)
Induction scheduled for 04/11/12 @ 7:30pm with SR/SL. -Adrianne Pridgen

## 2012-04-06 NOTE — Telephone Encounter (Signed)
Preadmission screen  

## 2012-04-07 ENCOUNTER — Other Ambulatory Visit: Payer: Self-pay

## 2012-04-07 DIAGNOSIS — O139 Gestational [pregnancy-induced] hypertension without significant proteinuria, unspecified trimester: Secondary | ICD-10-CM

## 2012-04-08 ENCOUNTER — Ambulatory Visit: Payer: BC Managed Care – PPO

## 2012-04-08 ENCOUNTER — Telehealth: Payer: Self-pay

## 2012-04-08 ENCOUNTER — Other Ambulatory Visit: Payer: BC Managed Care – PPO

## 2012-04-08 ENCOUNTER — Inpatient Hospital Stay (HOSPITAL_COMMUNITY)
Admission: AD | Admit: 2012-04-08 | Discharge: 2012-04-13 | DRG: 370 | Disposition: A | Discharge status: Home / Self Care | Payer: BC Managed Care – PPO | Source: Ambulatory Visit | Attending: Obstetrics and Gynecology | Admitting: Obstetrics and Gynecology

## 2012-04-08 ENCOUNTER — Encounter (HOSPITAL_COMMUNITY): Payer: Self-pay | Admitting: General Practice

## 2012-04-08 DIAGNOSIS — Z98891 History of uterine scar from previous surgery: Secondary | ICD-10-CM | POA: Diagnosis not present

## 2012-04-08 DIAGNOSIS — IMO0002 Reserved for concepts with insufficient information to code with codable children: Secondary | ICD-10-CM | POA: Diagnosis present

## 2012-04-08 DIAGNOSIS — E669 Obesity, unspecified: Secondary | ICD-10-CM | POA: Diagnosis present

## 2012-04-08 DIAGNOSIS — O99892 Other specified diseases and conditions complicating childbirth: Secondary | ICD-10-CM | POA: Diagnosis present

## 2012-04-08 DIAGNOSIS — Z6841 Body Mass Index (BMI) 40.0 and over, adult: Secondary | ICD-10-CM

## 2012-04-08 DIAGNOSIS — O139 Gestational [pregnancy-induced] hypertension without significant proteinuria, unspecified trimester: Secondary | ICD-10-CM

## 2012-04-08 DIAGNOSIS — O99214 Obesity complicating childbirth: Secondary | ICD-10-CM | POA: Diagnosis present

## 2012-04-08 DIAGNOSIS — O1490 Unspecified pre-eclampsia, unspecified trimester: Secondary | ICD-10-CM | POA: Diagnosis present

## 2012-04-08 DIAGNOSIS — O9903 Anemia complicating the puerperium: Secondary | ICD-10-CM | POA: Diagnosis not present

## 2012-04-08 DIAGNOSIS — D649 Anemia, unspecified: Secondary | ICD-10-CM | POA: Diagnosis not present

## 2012-04-08 DIAGNOSIS — Z3400 Encounter for supervision of normal first pregnancy, unspecified trimester: Secondary | ICD-10-CM

## 2012-04-08 DIAGNOSIS — F419 Anxiety disorder, unspecified: Secondary | ICD-10-CM | POA: Diagnosis present

## 2012-04-08 DIAGNOSIS — Z22330 Carrier of Group B streptococcus: Secondary | ICD-10-CM

## 2012-04-08 LAB — COMPREHENSIVE METABOLIC PANEL
AST: 55 U/L — ABNORMAL HIGH (ref 0–37)
Albumin: 2.1 g/dL — ABNORMAL LOW (ref 3.5–5.2)
BUN: 7 mg/dL (ref 6–23)
Calcium: 9.2 mg/dL (ref 8.4–10.5)
Chloride: 103 mEq/L (ref 96–112)
Potassium: 3.9 mEq/L (ref 3.5–5.3)

## 2012-04-08 LAB — CBC
HCT: 29.7 % — ABNORMAL LOW (ref 36.0–46.0)
MCV: 78.4 fL (ref 78.0–100.0)
Platelets: 329 10*3/uL (ref 150–400)
RBC: 3.79 MIL/uL — ABNORMAL LOW (ref 3.87–5.11)
WBC: 10.2 10*3/uL (ref 4.0–10.5)

## 2012-04-08 MED ORDER — PRENATAL MULTIVITAMIN CH
1.0000 | ORAL_TABLET | Freq: Every day | ORAL | Status: DC
  Administered 2012-04-09: 1 via ORAL
  Filled 2012-04-08: qty 1

## 2012-04-08 MED ORDER — RANITIDINE HCL 150 MG/10ML PO SYRP: 150.0000 mg | ORAL_SOLUTION | Freq: Two times a day (BID) | ORAL | Status: DC

## 2012-04-08 MED ORDER — METHYLDOPA 500 MG PO TABS
500.0000 mg | ORAL_TABLET | Freq: Two times a day (BID) | ORAL | Status: DC
  Administered 2012-04-08 – 2012-04-09 (×2): 500 mg via ORAL
  Filled 2012-04-08 (×2): qty 1

## 2012-04-08 MED ORDER — LABETALOL HCL 5 MG/ML IV SOLN
10.0000 mg | INTRAVENOUS | Status: DC | PRN
  Administered 2012-04-09: 10 mg via INTRAVENOUS
  Administered 2012-04-09: 20 mg via INTRAVENOUS
  Administered 2012-04-09: 10 mg via INTRAVENOUS
  Filled 2012-04-08 (×3): qty 4

## 2012-04-08 MED ORDER — SERTRALINE HCL 50 MG PO TABS
50.0000 mg | ORAL_TABLET | Freq: Every day | ORAL | Status: DC
  Administered 2012-04-08 – 2012-04-09 (×2): 50 mg via ORAL
  Filled 2012-04-08 (×4): qty 1

## 2012-04-08 MED ORDER — CALCIUM CARBONATE ANTACID 500 MG PO CHEW: 400.0000 mg | CHEWABLE_TABLET | ORAL | Status: DC | PRN

## 2012-04-08 MED ORDER — FAMOTIDINE 20 MG PO TABS
20.0000 mg | ORAL_TABLET | Freq: Two times a day (BID) | ORAL | Status: DC
  Administered 2012-04-08 – 2012-04-09 (×3): 20 mg via ORAL
  Filled 2012-04-08 (×3): qty 1

## 2012-04-08 MED ORDER — SODIUM CHLORIDE 0.9 % IJ SOLN
3.0000 mL | Freq: Two times a day (BID) | INTRAMUSCULAR | Status: DC
  Administered 2012-04-09: 3 mL via INTRAVENOUS

## 2012-04-08 NOTE — Telephone Encounter (Signed)
Per Diane, I called Dr. Su Hilt for instructions for this pt who just had an U/S today. She is 36+3 weeks today with B/P of 160/90 and on repeat 150/90. Per AR. Pt should increase her Aldomet from 250 mg bid  to 500 mg bid. Pt reports no H/A, no visual changes, no unusual epigastric or abdominal pain, no dizziness. Per AR, today we will repeat STAT PIH labs and  Pt is to collect 24 hr urine and bring it to [facility] tomorrow and also have repeat B/P tomorrow as well. Pt was given collection jug with instructions and she understands what she is supposed to do. I will f/u STAT labs this afternoon and report to AR. Melody Comas A

## 2012-04-08 NOTE — Telephone Encounter (Signed)
Spoke to pt to let her know that we got STAT labs back and because of elevated LFT's, AR rec's that pt go to hosp. Tonight for eval, b/p and continued collection of 24 hr urine.  Pt is agreeable. Melody Comas A

## 2012-04-08 NOTE — H&P (Signed)
Dawn Dawson is a 27 y.o. female presenting for 24 hour urine with elevated lab values. Denies uc, srom, or vag bleeding, with +FM, no headache, no visual blurring with some floaters at times,requests induction now and discussed labs pending will make that determination and verbalized understanding. History OB History    Grav Para Term Preterm Abortions TAB SAB Ect Mult Living   1              Past Medical History  Diagnosis Date  . Obese 10/04/2011    pregravid BMI=48  . Urinary tract infection   . Heart murmur     as child  . Anxiety   . Hypertension 2012    WAS TAKING ALDOMET  . HTN (hypertension)     Stopped Aldomet w/ positive UPT then restarted  Oct. 2013   Past Surgical History  Procedure Date  . Wisdom tooth extraction     ALL 4 EXTRACTED  . Cholecystectomy open 2007    NO COMPLICATIONS   Family History: family history includes Emphysema in her cousin and maternal uncle; Hypertension in her father; Kidney disease in her maternal grandmother; and Seizures in her maternal uncle.  There is no history of Other. Social History:  reports that she has never smoked. She has never used smokeless tobacco. She reports that she does not drink alcohol or use illicit drugs.  Medications Prior to Admission  Medication Sig Dispense Refill  . acetaminophen (TYLENOL) 500 MG tablet Take 500 mg by mouth every 6 (six) hours as needed. For pain in back and leggs      . guaifenesin (ROBITUSSIN) 100 MG/5ML syrup Take 200 mg by mouth 3 (three) times daily as needed.      . methyldopa (ALDOMET) 250 MG tablet Take 1 tablet (250 mg total) by mouth 2 (two) times daily.  60 tablet  6  . Prenatal Vit w/Fe-Methylfol-FA (PNV PO) Take by mouth daily.      . ranitidine (ZANTAC) 150 MG tablet Take 150 mg by mouth as needed.      . sertraline (ZOLOFT) 50 MG tablet Take 1 tablet (50 mg total) by mouth daily.  30 tablet  6  . [DISCONTINUED] calcium elemental as carbonate (TUMS ULTRA 1000) 400 MG tablet Chew  1,000 mg by mouth as needed.        Prenatal Transfer Tool  Maternal Diabetes: No Genetic Screening: Declined Maternal Ultrasounds/Referrals: Normal BPP 8/8 7#11 Vertex, AFI 11.8 posterior placenta on 1/16 Fetal Ultrasounds or other Referrals:  None Maternal Substance Abuse:  Not assessed Significant Maternal Medications:  None Significant Maternal Lab Results:  Lab values include: Other: see labs this week. Other Comments:  None Results for orders placed in visit on 04/08/12 (from the past 72 hour(s))  CBC     Status: Abnormal   Collection Time   04/08/12 12:08 PM      Component Value Range Comment   WBC 10.2  4.0 - 10.5 K/uL    RBC 3.79 (*) 3.87 - 5.11 MIL/uL    Hemoglobin 9.8 (*) 12.0 - 15.0 g/dL    HCT 44.0 (*) 10.2 - 46.0 %    MCV 78.4  78.0 - 100.0 fL    MCH 25.9 (*) 26.0 - 34.0 pg    MCHC 33.0  30.0 - 36.0 g/dL    RDW 72.5  36.6 - 44.0 %    Platelets 329  150 - 400 K/uL   LACTATE DEHYDROGENASE     Status: Normal  Collection Time   04/08/12 12:08 PM      Component Value Range Comment   LDH 198  94 - 250 U/L   COMPREHENSIVE METABOLIC PANEL     Status: Abnormal   Collection Time   04/08/12 12:08 PM      Component Value Range Comment   Sodium 138  135 - 145 mEq/L    Potassium 3.9  3.5 - 5.3 mEq/L    Chloride 103  96 - 112 mEq/L    CO2 22  19 - 32 mEq/L    Glucose, Bld 90  70 - 99 mg/dL    BUN 7  6 - 23 mg/dL    Creat 4.09  8.11 - 9.14 mg/dL    Total Bilirubin 0.3  0.3 - 1.2 mg/dL    Alkaline Phosphatase 229 (*) 39 - 117 U/L    AST 55 (*) 0 - 37 U/L    ALT 40 (*) 0 - 35 U/L    Total Protein 6.3  6.0 - 8.3 g/dL    Albumin 2.1 (*) 3.5 - 5.2 g/dL    Calcium 9.2  8.4 - 78.2 mg/dL   URIC ACID     Status: Normal   Collection Time   04/08/12 12:08 PM      Component Value Range Comment   Uric Acid, Serum 3.5  2.4 - 7.0 mg/dL     ROS    Blood pressure 158/99, pulse 109, temperature 98.5 F (36.9 C), temperature source Oral, resp. rate 20, height 5\' 3"  (1.6 m), weight  315 lb (142.883 kg). Exam Physical Exam Calm, no distress, cooperative, HEENT WNL grossly,  lungs clear bilaterally, AP RRR,  abd soft nt, not tympanic bowel sounds active,  +3 pitting edema to lower extremities and feet DTRS +1 bilaterally no clonus Fhts caterogy 1 UC irregular, denies Vag at office closed pt states  Prenatal labs: ABO, Rh: --/--/A POS, A POS (01/06 1942) Antibody: NEG (01/06 1942) Rubella: 32.7 (06/26 1557) RPR: NON REAC (11/25 1422)  HBsAg: NEGATIVE (06/26 1557)  HIV: NON REACTIVE (06/26 1557)  GBS: Positive, Positive, Positive (06/26 0000)   Assessment/Plan: [redacted]w[redacted]d GBS + Hypertension elevated liver function, 24 hour urine pending. Collaboration with Dr. Su Hilt, orders received by V. Emilee Hero prior to pt arrival.   [facility], [facility] 04/08/2012, 10:39 PM  Agree with above - AYR

## 2012-04-09 ENCOUNTER — Encounter (HOSPITAL_COMMUNITY): Payer: Self-pay | Admitting: *Deleted

## 2012-04-09 ENCOUNTER — Inpatient Hospital Stay (HOSPITAL_COMMUNITY): Payer: BC Managed Care – PPO

## 2012-04-09 DIAGNOSIS — O1490 Unspecified pre-eclampsia, unspecified trimester: Secondary | ICD-10-CM | POA: Diagnosis present

## 2012-04-09 LAB — CREATININE CLEARANCE, URINE, 24 HOUR
Collection Interval-CRCL: 24 hours
Creatinine Clearance: 217 mL/min — ABNORMAL HIGH (ref 75–115)
Creatinine, 24H Ur: 2122 mg/d — ABNORMAL HIGH (ref 700–1800)
Creatinine, Urine: 146.34 mg/dL
Urine Total Volume-CRCL: 1450 mL

## 2012-04-09 LAB — COMPREHENSIVE METABOLIC PANEL
ALT: 50 U/L — ABNORMAL HIGH (ref 0–35)
Albumin: 2.1 g/dL — ABNORMAL LOW (ref 3.5–5.2)
BUN: 8 mg/dL (ref 6–23)
Calcium: 8.9 mg/dL (ref 8.4–10.5)
GFR calc Af Amer: >90 mL/min (ref >90)
Glucose, Bld: 86 mg/dL (ref 70–99)
Sodium: 134 mEq/L — ABNORMAL LOW (ref 135–145)
Total Protein: 6.4 g/dL (ref 6.0–8.3)

## 2012-04-09 LAB — PROTEIN, URINE, 24 HOUR: Collection Interval-UPROT: 24 hours

## 2012-04-09 LAB — CBC
Hemoglobin: 9.6 g/dL — ABNORMAL LOW (ref 12.0–15.0)
MCH: 25.7 pg — ABNORMAL LOW (ref 26.0–34.0)
MCHC: 32.8 g/dL (ref 30.0–36.0)
RDW: 14.3 % (ref 11.5–15.5)

## 2012-04-09 LAB — PREPARE RBC (CROSSMATCH)

## 2012-04-09 LAB — LACTATE DEHYDROGENASE: LDH: 204 U/L (ref 94–250)

## 2012-04-09 MED ORDER — OXYTOCIN 40 UNITS IN LACTATED RINGERS INFUSION - SIMPLE MED: 62.5000 mL/h | INTRAVENOUS | Status: DC

## 2012-04-09 MED ORDER — ZOLPIDEM TARTRATE 5 MG PO TABS
10.0000 mg | ORAL_TABLET | Freq: Every evening | ORAL | Status: DC | PRN
  Administered 2012-04-09: 10 mg via ORAL
  Filled 2012-04-09 (×2): qty 1

## 2012-04-09 MED ORDER — MISOPROSTOL 25 MCG QUARTER TABLET
25.0000 ug | ORAL_TABLET | ORAL | Status: DC | PRN
  Administered 2012-04-09 – 2012-04-10 (×2): 25 ug via VAGINAL
  Filled 2012-04-09 (×3): qty 0.25

## 2012-04-09 MED ORDER — LACTATED RINGERS IV SOLN: 500.0000 mL | INTRAVENOUS | Status: DC | PRN

## 2012-04-09 MED ORDER — LABETALOL HCL 200 MG PO TABS
400.0000 mg | ORAL_TABLET | Freq: Two times a day (BID) | ORAL | Status: DC
  Administered 2012-04-09 (×2): 400 mg via ORAL
  Filled 2012-04-09 (×5): qty 2

## 2012-04-09 MED ORDER — ACETAMINOPHEN 325 MG PO TABS: 650.0000 mg | ORAL_TABLET | ORAL | Status: DC | PRN

## 2012-04-09 MED ORDER — OXYTOCIN BOLUS FROM INFUSION: 500.0000 mL | INTRAVENOUS | Status: DC

## 2012-04-09 MED ORDER — CITRIC ACID-SODIUM CITRATE 334-500 MG/5ML PO SOLN
30.0000 mL | ORAL | Status: DC | PRN
  Administered 2012-04-10: 30 mL via ORAL
  Filled 2012-04-09: qty 15

## 2012-04-09 MED ORDER — PROMETHAZINE HCL 25 MG/ML IJ SOLN: 12.5000 mg | INTRAMUSCULAR | Status: DC | PRN

## 2012-04-09 MED ORDER — LACTATED RINGERS IV SOLN
INTRAVENOUS | Status: DC
  Administered 2012-04-10 (×2): 75 mL via INTRAVENOUS

## 2012-04-09 MED ORDER — IBUPROFEN 600 MG PO TABS: 600.0000 mg | ORAL_TABLET | Freq: Four times a day (QID) | ORAL | Status: DC | PRN

## 2012-04-09 MED ORDER — LIDOCAINE HCL (PF) 1 % IJ SOLN: 30.0000 mL | INTRAMUSCULAR | Status: DC | PRN

## 2012-04-09 MED ORDER — PENICILLIN G POTASSIUM 5000000 UNITS IJ SOLR
5.0000 10*6.[IU] | Freq: Once | INTRAVENOUS | Status: DC
  Filled 2012-04-09: qty 5

## 2012-04-09 MED ORDER — BUTORPHANOL TARTRATE 1 MG/ML IJ SOLN: 1.0000 mg | INTRAMUSCULAR | Status: DC | PRN

## 2012-04-09 MED ORDER — PENICILLIN G POTASSIUM 5000000 UNITS IJ SOLR
2.5000 10*6.[IU] | INTRAVENOUS | Status: DC
  Administered 2012-04-10 (×3): 2.5 10*6.[IU] via INTRAVENOUS
  Filled 2012-04-09 (×6): qty 2.5

## 2012-04-09 MED ORDER — ACETAMINOPHEN 325 MG PO TABS
650.0000 mg | ORAL_TABLET | ORAL | Status: DC | PRN
  Administered 2012-04-09 (×3): 650 mg via ORAL
  Filled 2012-04-09 (×3): qty 2

## 2012-04-09 MED ORDER — ONDANSETRON HCL 4 MG/2ML IJ SOLN
4.0000 mg | Freq: Four times a day (QID) | INTRAMUSCULAR | Status: DC | PRN
  Administered 2012-04-10 (×2): 4 mg via INTRAVENOUS
  Filled 2012-04-09 (×2): qty 2

## 2012-04-09 MED ORDER — TERBUTALINE SULFATE 1 MG/ML IJ SOLN: 0.2500 mg | Freq: Once | INTRAMUSCULAR | Status: AC | PRN

## 2012-04-09 NOTE — Progress Notes (Signed)
Pt has not been put on monitor yet; tracing is an error, cardio plugged in with gel on it and tracing when not on pt

## 2012-04-09 NOTE — Progress Notes (Signed)
Comfortable, denies uc, srom, vag bleeding,with +FM, agrees to cytotec O BP 126/72  Pulse 104  Temp 98.1 F (36.7 C) (Oral)  Resp 18  Ht 5\' 3"  (1.6 m)  Wt 315 lb (142.883 kg)  BMI 55.80 kg/m2      fhts category 1      abd soft       Contractions slight irritability, few      EFW 6#11 last week [place]      Vag closed 60 -3 Intact Vertex per [place] Dr. Su Hilt bedside A [redacted]w[redacted]d HTN now with pre eclampsi for induction    GBS + P cytotec discussed,Pen G start at 0200, collaboration with Dr. Su Hilt, agrees to foley bulb when dilated, continue care Dawn Dawson, CNM

## 2012-04-09 NOTE — Progress Notes (Addendum)
Patient has required IV Labetalol x 3 this am.  Per consult with Dr. Su Hilt: D/C Aldomet Rx Labetalol 400 mg po BID Check BPP and AFI.  Await 24 hour urine results--if elevated protein, will start induction. If protein < 300, will consult with Dr. Su Hilt.  Nigel Bridgeman, CNM 04/09/12 1:05p  24hr urine returned with 682mg  protein.  In light of CHTN with SI now sever preeclampsia with elevated liver function tests, BPs requiring IV labetalol several times and pt c/o seeing floaters, will proceed with induction.  Pt was already scheduled for induction at 37wks per MFM recommendations which included if worsened to SI severe preeclampsia proceed with delivery.  BPP earlier today was 8/8 with nl fluid and vertex presentation.  Will plan for Magnesium for seizure prophylaxis after delivery.

## 2012-04-09 NOTE — Progress Notes (Addendum)
Hospital day # 1 pregnancy at [redacted]w[redacted]d--Chronic hypertension, elevated LFTs.  S:  Doing well, reports good fetal activity.  Reports mild HA, denies visual sx or epigastric pain.  Some aching in back      and legs.  SCDs on.      Perception of contractions: none      Vaginal bleeding: None       Vaginal discharge:  None  O: BP 147/88  Pulse 105  Temp 97.8 F (36.6 C) (Oral)  Resp 18  Ht 5\' 3"  (1.6 m)  Wt 315 lb (142.883 kg)  BMI 55.80 kg/m2 Filed Vitals:   04/09/12 0612 04/09/12 0824 04/09/12 0843 04/09/12 0850  BP: 154/100 160/104 144/89 147/88  Pulse: 103 107 95 105  Temp: 98.5 F (36.9 C) 97.8 F (36.6 C)    TempSrc: Oral Oral    Resp: 20 18    Height:      Weight:       Received Labetalol 10 mg IV at 8:30am for elevated BP. On Aldomet 500 mg po BID--dose at 9:40a.  Results for orders placed during the hospital encounter of 04/08/12 (from the past 24 hour(s))  CBC     Status: Abnormal   Collection Time   04/09/12  5:45 AM      Component Value Range   WBC 11.9 (*) 4.0 - 10.5 K/uL   RBC 3.73 (*) 3.87 - 5.11 MIL/uL   Hemoglobin 9.6 (*) 12.0 - 15.0 g/dL   HCT 82.9 (*) 56.2 - 13.0 %   MCV 78.6  78.0 - 100.0 fL   MCH 25.7 (*) 26.0 - 34.0 pg   MCHC 32.8  30.0 - 36.0 g/dL   RDW 86.5  78.4 - 69.6 %   Platelets 340  150 - 400 K/uL  COMPREHENSIVE METABOLIC PANEL     Status: Abnormal   Collection Time   04/09/12  5:45 AM      Component Value Range   Sodium 134 (*) 135 - 145 mEq/L   Potassium 3.7  3.5 - 5.1 mEq/L   Chloride 102  96 - 112 mEq/L   CO2 19  19 - 32 mEq/L   Glucose, Bld 86  70 - 99 mg/dL   BUN 8  6 - 23 mg/dL   Creatinine, Ser 2.95  0.50 - 1.10 mg/dL   Calcium 8.9  8.4 - 28.4 mg/dL   Total Protein 6.4  6.0 - 8.3 g/dL   Albumin 2.1 (*) 3.5 - 5.2 g/dL   AST 66 (*) 0 - 37 U/L   ALT 50 (*) 0 - 35 U/L   Alkaline Phosphatase 219 (*) 39 - 117 U/L   Total Bilirubin 0.3  0.3 - 1.2 mg/dL   GFR calc non Af Amer >90  >90 mL/min   GFR calc Af Amer >90  >90 mL/min    LACTATE DEHYDROGENASE     Status: Normal   Collection Time   04/09/12  5:45 AM      Component Value Range   LDH 204  94 - 250 U/L  URIC ACID     Status: Normal   Collection Time   04/09/12  5:45 AM      Component Value Range   Uric Acid, Serum 3.5  2.4 - 7.0 mg/dL  TYPE AND SCREEN     Status: Normal   Collection Time   04/09/12  5:45 AM      Component Value Range   ABO/RH(D) A POS  Antibody Screen NEG     Sample Expiration 04/12/2012     ALT/AST slightly higher than yesterday--66/50 today, 55/40 yesterday. Other PIH labs WNL  24 hour urine in process--will complete at 3:15pm.        Fetal tracings:  Category 1      Contractions:   None      Uterus gravid and non-tender      Extremities: edema 2-3+, DTR 2+ without clonus. and Homans sign is negative, no sign of DVT.  SCDs on.          A: [redacted]w[redacted]d with chronic hypertension, elevated LFTs  P: Continue current plan of care--scheduled for induction on 04/11/12.      Await 24 hour urine result      Will consult regarding timing of repeat labs (today vs in am)      Reviewed plan of care with patient and family--questions reviewed, support offered.      MDs will follow  Nigel Bridgeman CNM, [place] 04/09/2012 10:40 AM    24 hr urine to be completed today.  Will f/u results.  Repeat labs in am.

## 2012-04-10 ENCOUNTER — Encounter (HOSPITAL_COMMUNITY): Payer: Self-pay | Admitting: Family Medicine

## 2012-04-10 ENCOUNTER — Inpatient Hospital Stay (HOSPITAL_COMMUNITY): Payer: BC Managed Care – PPO | Admitting: Anesthesiology

## 2012-04-10 ENCOUNTER — Encounter (HOSPITAL_COMMUNITY)
Admission: AD | Disposition: A | Discharge status: Home / Self Care | Payer: Self-pay | Source: Ambulatory Visit | Attending: Obstetrics and Gynecology

## 2012-04-10 ENCOUNTER — Encounter (HOSPITAL_COMMUNITY): Payer: Self-pay | Admitting: Anesthesiology

## 2012-04-10 LAB — CBC
HCT: 29.4 % — ABNORMAL LOW (ref 36.0–46.0)
Hemoglobin: 9.6 g/dL — ABNORMAL LOW (ref 12.0–15.0)
MCH: 25.8 pg — ABNORMAL LOW (ref 26.0–34.0)
MCH: 24.9 pg — ABNORMAL LOW (ref 26.0–34.0)
MCHC: 32.7 g/dL (ref 30.0–36.0)
MCV: 79 fL (ref 78.0–100.0)
MCV: 78.8 fL (ref 78.0–100.0)
Platelets: 325 10*3/uL (ref 150–400)
Platelets: 334 10*3/uL (ref 150–400)
RBC: 3.72 MIL/uL — ABNORMAL LOW (ref 3.87–5.11)
RBC: 3.77 MIL/uL — ABNORMAL LOW (ref 3.87–5.11)
RDW: 14.6 % (ref 11.5–15.5)
RDW: 14.6 % (ref 11.5–15.5)
WBC: 11.1 10*3/uL — ABNORMAL HIGH (ref 4.0–10.5)

## 2012-04-10 LAB — MAGNESIUM: Magnesium: 3.2 mg/dL — ABNORMAL HIGH (ref 1.5–2.5)

## 2012-04-10 LAB — COMPREHENSIVE METABOLIC PANEL
ALT: 53 U/L — ABNORMAL HIGH (ref 0–35)
AST: 68 U/L — ABNORMAL HIGH (ref 0–37)
AST: 62 U/L — ABNORMAL HIGH (ref 0–37)
Albumin: 2.1 g/dL — ABNORMAL LOW (ref 3.5–5.2)
Albumin: 2.1 g/dL — ABNORMAL LOW (ref 3.5–5.2)
CO2: 19 mEq/L (ref 19–32)
Calcium: 9 mg/dL (ref 8.4–10.5)
Calcium: 8.8 mg/dL (ref 8.4–10.5)
Creatinine, Ser: 0.71 mg/dL (ref 0.50–1.10)
Creatinine, Ser: 0.65 mg/dL (ref 0.50–1.10)
GFR calc non Af Amer: >90 mL/min (ref >90)
GFR calc non Af Amer: >90 mL/min (ref >90)
Sodium: 135 mEq/L (ref 135–145)
Total Protein: 6.4 g/dL (ref 6.0–8.3)
Total Protein: 6.7 g/dL (ref 6.0–8.3)

## 2012-04-10 LAB — URIC ACID
Uric Acid, Serum: 4 mg/dL (ref 2.4–7.0)
Uric Acid, Serum: 4.5 mg/dL (ref 2.4–7.0)

## 2012-04-10 SURGERY — Surgical Case
Anesthesia: Epidural | Site: Abdomen | Wound class: Clean Contaminated

## 2012-04-10 MED ORDER — TERBUTALINE SULFATE 1 MG/ML IJ SOLN: 0.2500 mg | Freq: Once | INTRAMUSCULAR | Status: DC | PRN

## 2012-04-10 MED ORDER — ONDANSETRON HCL 4 MG/2ML IJ SOLN
INTRAMUSCULAR | Status: DC | PRN
  Administered 2012-04-10: 4 mg via INTRAVENOUS

## 2012-04-10 MED ORDER — PHENYLEPHRINE 40 MCG/ML (10ML) SYRINGE FOR IV PUSH (FOR BLOOD PRESSURE SUPPORT)
80.0000 ug | PREFILLED_SYRINGE | INTRAVENOUS | Status: DC | PRN
  Administered 2012-04-10: 80 ug via INTRAVENOUS
  Filled 2012-04-10 (×2): qty 5

## 2012-04-10 MED ORDER — MAGNESIUM SULFATE 40 G IN LACTATED RINGERS - SIMPLE
2.0000 g/h | INTRAVENOUS | Status: DC
  Administered 2012-04-10: 2 g/h via INTRAVENOUS
  Administered 2012-04-10: 4 g/h via INTRAVENOUS
  Administered 2012-04-11: 2 g/h via INTRAVENOUS
  Filled 2012-04-10 (×2): qty 500

## 2012-04-10 MED ORDER — KETOROLAC TROMETHAMINE 60 MG/2ML IM SOLN
INTRAMUSCULAR | Status: AC
  Administered 2012-04-11: 60 mg via INTRAMUSCULAR
  Filled 2012-04-10: qty 2

## 2012-04-10 MED ORDER — SCOPOLAMINE 1 MG/3DAYS TD PT72
1.0000 | MEDICATED_PATCH | Freq: Once | TRANSDERMAL | Status: DC
  Administered 2012-04-11: 1.5 mg via TRANSDERMAL

## 2012-04-10 MED ORDER — OXYTOCIN 10 UNIT/ML IJ SOLN
INTRAMUSCULAR | Status: AC
  Filled 2012-04-10: qty 4

## 2012-04-10 MED ORDER — SCOPOLAMINE 1 MG/3DAYS TD PT72
MEDICATED_PATCH | TRANSDERMAL | Status: AC
  Filled 2012-04-10: qty 1

## 2012-04-10 MED ORDER — OXYTOCIN 10 UNIT/ML IJ SOLN
40.0000 [IU] | INTRAVENOUS | Status: DC | PRN
  Administered 2012-04-10: 40 [IU] via INTRAVENOUS

## 2012-04-10 MED ORDER — LACTATED RINGERS IV SOLN
INTRAVENOUS | Status: DC | PRN
  Administered 2012-04-10 (×2): via INTRAVENOUS

## 2012-04-10 MED ORDER — METOCLOPRAMIDE HCL 5 MG/ML IJ SOLN
INTRAMUSCULAR | Status: AC
  Filled 2012-04-10: qty 2

## 2012-04-10 MED ORDER — DIPHENHYDRAMINE HCL 50 MG/ML IJ SOLN: 12.5000 mg | INTRAMUSCULAR | Status: DC | PRN

## 2012-04-10 MED ORDER — MAGNESIUM SULFATE 40 MG/ML IJ SOLN
4.0000 g | Freq: Once | INTRAMUSCULAR | Status: DC
  Filled 2012-04-10: qty 100

## 2012-04-10 MED ORDER — LACTATED RINGERS IV SOLN
500.0000 mL | Freq: Once | INTRAVENOUS | Status: DC
Start: 2012-04-10 — End: 2012-04-10

## 2012-04-10 MED ORDER — FENTANYL CITRATE 0.05 MG/ML IJ SOLN
25.0000 ug | INTRAMUSCULAR | Status: DC | PRN
  Administered 2012-04-10 – 2012-04-11 (×4): 50 ug via INTRAVENOUS

## 2012-04-10 MED ORDER — DEXAMETHASONE SODIUM PHOSPHATE 10 MG/ML IJ SOLN
INTRAMUSCULAR | Status: AC
  Filled 2012-04-10: qty 1

## 2012-04-10 MED ORDER — METOCLOPRAMIDE HCL 5 MG/ML IJ SOLN
INTRAMUSCULAR | Status: DC | PRN
  Administered 2012-04-10 (×2): 5 mg via INTRAVENOUS

## 2012-04-10 MED ORDER — EPHEDRINE 5 MG/ML INJ: 10.0000 mg | INTRAVENOUS | Status: DC | PRN

## 2012-04-10 MED ORDER — FENTANYL CITRATE 0.05 MG/ML IJ SOLN: 100.0000 ug | INTRAMUSCULAR | Status: DC | PRN

## 2012-04-10 MED ORDER — MORPHINE SULFATE (PF) 0.5 MG/ML IJ SOLN
INTRAMUSCULAR | Status: DC | PRN
  Administered 2012-04-10: .15 mg via INTRATHECAL

## 2012-04-10 MED ORDER — MORPHINE SULFATE 0.5 MG/ML IJ SOLN
INTRAMUSCULAR | Status: AC
  Filled 2012-04-10: qty 10

## 2012-04-10 MED ORDER — EPHEDRINE 5 MG/ML INJ
10.0000 mg | INTRAVENOUS | Status: DC | PRN
  Administered 2012-04-10: 10 mg via INTRAVENOUS
  Filled 2012-04-10 (×2): qty 4

## 2012-04-10 MED ORDER — MEPERIDINE HCL 25 MG/ML IJ SOLN: 6.2500 mg | INTRAMUSCULAR | Status: DC | PRN

## 2012-04-10 MED ORDER — FENTANYL CITRATE 0.05 MG/ML IJ SOLN
INTRAMUSCULAR | Status: AC
  Filled 2012-04-10: qty 2

## 2012-04-10 MED ORDER — LIDOCAINE HCL (PF) 1 % IJ SOLN
INTRAMUSCULAR | Status: DC | PRN
  Administered 2012-04-10: 10 mL
  Administered 2012-04-10: 9 mL

## 2012-04-10 MED ORDER — FENTANYL 2.5 MCG/ML BUPIVACAINE 1/10 % EPIDURAL INFUSION (WH - ANES)
14.0000 mL/h | INTRAMUSCULAR | Status: DC
  Administered 2012-04-10: 14 mL/h via EPIDURAL
  Filled 2012-04-10 (×2): qty 125

## 2012-04-10 MED ORDER — DEXAMETHASONE SODIUM PHOSPHATE 10 MG/ML IJ SOLN
INTRAMUSCULAR | Status: DC | PRN
  Administered 2012-04-10: 10 mg via INTRAVENOUS

## 2012-04-10 MED ORDER — KETOROLAC TROMETHAMINE 60 MG/2ML IM SOLN: 60.0000 mg | Freq: Once | INTRAMUSCULAR | Status: AC | PRN

## 2012-04-10 MED ORDER — CEFAZOLIN SODIUM 10 G IJ SOLR
3.0000 g | Freq: Once | INTRAMUSCULAR | Status: DC
  Filled 2012-04-10: qty 3000

## 2012-04-10 MED ORDER — FENTANYL 2.5 MCG/ML BUPIVACAINE 1/10 % EPIDURAL INFUSION (WH - ANES)
INTRAMUSCULAR | Status: DC | PRN
  Administered 2012-04-10: 14 mL/h via EPIDURAL

## 2012-04-10 MED ORDER — DEXTROSE 5 % IV SOLN
3.0000 g | INTRAVENOUS | Status: DC | PRN
  Administered 2012-04-10: 3 g via INTRAVENOUS

## 2012-04-10 MED ORDER — 0.9 % SODIUM CHLORIDE (POUR BTL) OPTIME
TOPICAL | Status: DC | PRN
  Administered 2012-04-10: 200 mL
  Administered 2012-04-10: 300 mL

## 2012-04-10 MED ORDER — BUPIVACAINE HCL (PF) 0.5 % IJ SOLN
INTRAMUSCULAR | Status: DC | PRN
  Administered 2012-04-10: .5 mL

## 2012-04-10 MED ORDER — PHENYLEPHRINE HCL 10 MG/ML IJ SOLN
INTRAMUSCULAR | Status: DC | PRN
  Administered 2012-04-10 (×2): 80 ug via INTRAVENOUS
  Administered 2012-04-10: 40 ug via INTRAVENOUS

## 2012-04-10 MED ORDER — PHENYLEPHRINE 40 MCG/ML (10ML) SYRINGE FOR IV PUSH (FOR BLOOD PRESSURE SUPPORT): 80.0000 ug | PREFILLED_SYRINGE | INTRAVENOUS | Status: DC | PRN

## 2012-04-10 MED ORDER — MAGNESIUM SULFATE BOLUS VIA INFUSION
4.0000 g | Freq: Once | INTRAVENOUS | Status: DC
  Filled 2012-04-10: qty 500

## 2012-04-10 MED ORDER — ONDANSETRON HCL 4 MG/2ML IJ SOLN
INTRAMUSCULAR | Status: AC
  Filled 2012-04-10: qty 2

## 2012-04-10 MED ORDER — OXYTOCIN 40 UNITS IN LACTATED RINGERS INFUSION - SIMPLE MED
1.0000 m[IU]/min | INTRAVENOUS | Status: DC
  Administered 2012-04-10: 1 m[IU]/min via INTRAVENOUS
  Filled 2012-04-10: qty 1000

## 2012-04-10 SURGICAL SUPPLY — 39 items
APL SKNCLS STERI-STRIP NONHPOA (GAUZE/BANDAGES/DRESSINGS) ×1
BENZOIN TINCTURE PRP APPL 2/3 (GAUZE/BANDAGES/DRESSINGS) ×2 IMPLANT
CLOTH BEACON ORANGE TIMEOUT ST (SAFETY) ×2 IMPLANT
CONTAINER PREFILL 10% NBF 15ML (MISCELLANEOUS) IMPLANT
DRAIN JACKSON PRT FLT 7MM (DRAIN) ×1 IMPLANT
DRAPE LG THREE QUARTER DISP (DRAPES) ×2 IMPLANT
DRSG OPSITE POSTOP 4X10 (GAUZE/BANDAGES/DRESSINGS) ×2 IMPLANT
DURAPREP 26ML APPLICATOR (WOUND CARE) ×2 IMPLANT
ELECT REM PT RETURN 9FT ADLT (ELECTROSURGICAL) ×2
ELECTRODE REM PT RTRN 9FT ADLT (ELECTROSURGICAL) ×1 IMPLANT
EVACUATOR SILICONE 100CC (DRAIN) ×1 IMPLANT
EXTRACTOR VACUUM M CUP 4 TUBE (SUCTIONS) IMPLANT
GAUZE SPONGE 4X4 12PLY STRL LF (GAUZE/BANDAGES/DRESSINGS) ×1 IMPLANT
GLOVE BIO SURGEON STRL SZ7.5 (GLOVE) ×4 IMPLANT
GLOVE BIOGEL PI IND STRL 7.5 (GLOVE) ×1 IMPLANT
GLOVE BIOGEL PI INDICATOR 7.5 (GLOVE) ×1
GOWN PREVENTION PLUS LG XLONG (DISPOSABLE) ×6 IMPLANT
KIT ABG SYR 3ML LUER SLIP (SYRINGE) IMPLANT
NDL HYPO 25X5/8 SAFETYGLIDE (NEEDLE) IMPLANT
NEEDLE HYPO 22GX1.5 SAFETY (NEEDLE) IMPLANT
NEEDLE HYPO 25X5/8 SAFETYGLIDE (NEEDLE) IMPLANT
NS IRRIG 1000ML POUR BTL (IV SOLUTION) ×2 IMPLANT
PACK C SECTION WH (CUSTOM PROCEDURE TRAY) ×2 IMPLANT
PAD OB MATERNITY 4.3X12.25 (PERSONAL CARE ITEMS) ×2 IMPLANT
RETRACTOR WND ALEXIS 25 LRG (MISCELLANEOUS) ×1 IMPLANT
RTRCTR WOUND ALEXIS 25CM LRG (MISCELLANEOUS) ×2
SLEEVE SCD COMPRESS KNEE MED (MISCELLANEOUS) ×1 IMPLANT
STRIP CLOSURE SKIN 1/2X4 (GAUZE/BANDAGES/DRESSINGS) ×2 IMPLANT
SUT CHROMIC 2 0 CT 1 (SUTURE) ×3 IMPLANT
SUT MNCRL AB 3-0 PS2 27 (SUTURE) ×2 IMPLANT
SUT PLAIN 0 NONE (SUTURE) IMPLANT
SUT PLAIN 2 0 XLH (SUTURE) ×2 IMPLANT
SUT VIC AB 0 CT1 36 (SUTURE) ×3 IMPLANT
SUT VIC AB 0 CTX 36 (SUTURE) ×6
SUT VIC AB 0 CTX36XBRD ANBCTRL (SUTURE) ×3 IMPLANT
SYR CONTROL 10ML LL (SYRINGE) IMPLANT
TOWEL OR 17X24 6PK STRL BLUE (TOWEL DISPOSABLE) ×6 IMPLANT
TRAY FOLEY CATH 14FR (SET/KITS/TRAYS/PACK) ×1 IMPLANT
WATER STERILE IRR 1000ML POUR (IV SOLUTION) ×2 IMPLANT

## 2012-04-10 NOTE — OR Nursing (Signed)
50 ml blood loss during fundal massage by DLWegner RN, cord blood x 2 at front desk

## 2012-04-10 NOTE — Progress Notes (Signed)
crampy has been dozing agrees to foley bulb, pitocin O BP 144/70  Pulse 99  Temp 98 F (36.7 C) (Oral)  Resp 20  Ht 5\' 3"  (1.6 m)  Wt 315 lb (142.883 kg)  BMI 55.80 kg/m2      fhts 140 LTV min      abd soft between uc mild      Contractions q 2-3 1/2      Vag 1 60 -3 Intact foley bulb place easily in lithotomy position A [redacted]w[redacted]d induction    HTN with pre eclampsia P discussed IV pitocin if indicated, husband at bedside, continue care Lavera Guise, CNM

## 2012-04-10 NOTE — Progress Notes (Signed)
VIA ROSADO is a 27 y.o. G1P0 at [redacted]w[redacted]d by LMP admitted for induction of labor due to Pre-eclamptic toxemia of pregnancy..  Subjective:  Still very numb, denies pain  Objective: BP 133/62  Pulse 103  Temp 97.4 F (36.3 C) (Oral)  Resp 18  Ht 5\' 3"  (1.6 m)  Wt 315 lb (142.883 kg)  BMI 55.80 kg/m2  SpO2 98% I/O last 3 completed shifts: In: 480 [P.O.:480] Out: 425 [Urine:425] Total I/O In: 1001.1 [P.O.:148; I.V.:853.1] Out: 1750 [Urine:1750]  FHT:  FHR: 120 bpm, variability: moderate,  accelerations:  Present,  decelerations:  Absent UC:   regular, every 3-4 minutes SVE:   Dilation: 5 Effacement (%): 100 Station: -2 Exam by:: Sanda Klein, CNM   MVU's 240    Results for orders placed during the hospital encounter of 04/08/12 (from the past 24 hour(s))  CBC     Status: Abnormal   Collection Time   04/10/12  5:30 AM      Component Value Range   WBC 11.1 (*) 4.0 - 10.5 K/uL   RBC 3.72 (*) 3.87 - 5.11 MIL/uL   Hemoglobin 9.6 (*) 12.0 - 15.0 g/dL   HCT 16.1 (*) 09.6 - 04.5 %   MCV 79.0  78.0 - 100.0 fL   MCH 25.8 (*) 26.0 - 34.0 pg   MCHC 32.7  30.0 - 36.0 g/dL   RDW 40.9  81.1 - 91.4 %   Platelets 325  150 - 400 K/uL  COMPREHENSIVE METABOLIC PANEL     Status: Abnormal   Collection Time   04/10/12  5:30 AM      Component Value Range   Sodium 133 (*) 135 - 145 mEq/L   Potassium 3.9  3.5 - 5.1 mEq/L   Chloride 102  96 - 112 mEq/L   CO2 18 (*) 19 - 32 mEq/L   Glucose, Bld 93  70 - 99 mg/dL   BUN 8  6 - 23 mg/dL   Creatinine, Ser 7.82  0.50 - 1.10 mg/dL   Calcium 9.0  8.4 - 95.6 mg/dL   Total Protein 6.4  6.0 - 8.3 g/dL   Albumin 2.1 (*) 3.5 - 5.2 g/dL   AST 68 (*) 0 - 37 U/L   ALT 58 (*) 0 - 35 U/L   Alkaline Phosphatase 219 (*) 39 - 117 U/L   Total Bilirubin 0.3  0.3 - 1.2 mg/dL   GFR calc non Af Amer >90  >90 mL/min   GFR calc Af Amer >90  >90 mL/min  LACTATE DEHYDROGENASE     Status: Normal   Collection Time   04/10/12  5:30 AM      Component Value  Range   LDH 248  94 - 250 U/L  URIC ACID     Status: Normal   Collection Time   04/10/12  5:30 AM      Component Value Range   Uric Acid, Serum 4.0  2.4 - 7.0 mg/dL  CBC     Status: Abnormal   Collection Time   04/10/12  6:09 PM      Component Value Range   WBC 14.9 (*) 4.0 - 10.5 K/uL   RBC 3.77 (*) 3.87 - 5.11 MIL/uL   Hemoglobin 9.4 (*) 12.0 - 15.0 g/dL   HCT 21.3 (*) 08.6 - 57.8 %   MCV 78.8  78.0 - 100.0 fL   MCH 24.9 (*) 26.0 - 34.0 pg   MCHC 31.6  30.0 - 36.0 g/dL  RDW 14.6  11.5 - 15.5 %   Platelets 334  150 - 400 K/uL  COMPREHENSIVE METABOLIC PANEL     Status: Abnormal   Collection Time   04/10/12  6:09 PM      Component Value Range   Sodium 135  135 - 145 mEq/L   Potassium 4.2  3.5 - 5.1 mEq/L   Chloride 102  96 - 112 mEq/L   CO2 19  19 - 32 mEq/L   Glucose, Bld 91  70 - 99 mg/dL   BUN 6  6 - 23 mg/dL   Creatinine, Ser 1.61  0.50 - 1.10 mg/dL   Calcium 8.8  8.4 - 09.6 mg/dL   Total Protein 6.7  6.0 - 8.3 g/dL   Albumin 2.1 (*) 3.5 - 5.2 g/dL   AST 62 (*) 0 - 37 U/L   ALT 53 (*) 0 - 35 U/L   Alkaline Phosphatase 215 (*) 39 - 117 U/L   Total Bilirubin 0.4  0.3 - 1.2 mg/dL   GFR calc non Af Amer >90  >90 mL/min   GFR calc Af Amer >90  >90 mL/min  LACTATE DEHYDROGENASE     Status: Normal   Collection Time   04/10/12  6:09 PM      Component Value Range   LDH 198  94 - 250 U/L  URIC ACID     Status: Normal   Collection Time   04/10/12  6:09 PM      Component Value Range   Uric Acid, Serum 4.5  2.4 - 7.0 mg/dL  MAGNESIUM     Status: Abnormal   Collection Time   04/10/12  6:09 PM      Component Value Range   Magnesium 3.2 (*) 1.5 - 2.5 mg/dL     Assessment / Plan: IOL for pre-eclampsia Labs stable   Labor: no change since AROM Preeclampsia:  on magnesium sulfate Fetal Wellbeing:  Category I Pain Control:  Epidural I/D:  n/a Anticipated MOD:  NSVD  Aldea Avis M 04/10/2012, 6:58 PM

## 2012-04-10 NOTE — Progress Notes (Signed)
Dawn Dawson is a 27 y.o. G1P0 at [redacted]w[redacted]d by LMP admitted for induction of labor due to Pre-eclamptic toxemia of pregnancy..  Subjective: Getting comfortable w epidural, denies HA/N/V/RUQ pain   Objective: BP 126/61  Pulse 96  Temp 98.3 F (36.8 C) (Oral)  Resp 18  Ht 5\' 3"  (1.6 m)  Wt 315 lb (142.883 kg)  BMI 55.80 kg/m2  SpO2 99% I/O last 3 completed shifts: In: 480 [P.O.:480] Out: 425 [Urine:425]    FHT:  FHR: 140 bpm, variability: moderate,  accelerations:  Present,  decelerations:  Absent UC:   regular, every 2-4 minutes SVE:   Dilation: 2.5 Effacement (%): 60 Station: -3 Exam by:: Sanda Klein, CNM   Foley bulb remains in place  Bilateral LEE 3+ DTR's 2+  Labs: Lab Results  Component Value Date   WBC 11.1* 04/10/2012   HGB 9.6* 04/10/2012   HCT 29.4* 04/10/2012   MCV 79.0 04/10/2012   PLT 325 04/10/2012    Assessment / Plan: IOL for pre-eclampsia at [redacted]w[redacted]d As rcvd first dose of PCN Will begin pitocin augmentation Hold PO labetalol  CTO closely    Labor: cervical ripening overnight Preeclampsia:  no signs or symptoms of toxicity, intake and ouput balanced, labs stable and will begin mag sulfate w 4g bolus and 2g/hour Fetal Wellbeing:  Category I Pain Control:  Epidural I/D:  n/a Anticipated MOD:  NSVD  RV'd status w Dr Evlyn Kanner M 04/10/2012, 9:21 AM

## 2012-04-10 NOTE — Progress Notes (Signed)
fhts 130s LTV min UC q 3-4  Prior to [employer] requested adjustment of EFM without fhts/uc tracing on review. Plan reassess vag for cytotec or foley bulb at 0430 Lavera Guise, CNM

## 2012-04-10 NOTE — Progress Notes (Signed)
This note also relates to the following rows which could not be included: Dose (milli-units/min) Oxytocin - Cannot attach notes to extension rows Rate (mL/hr) Oxytocin - Cannot attach notes to extension rows Concentration Oxytocin - Cannot attach notes to extension rows   Pitocin increased to 58mu/min per Sanda Klein order.

## 2012-04-10 NOTE — Anesthesia Preprocedure Evaluation (Addendum)
Anesthesia Evaluation  Patient identified by MRN, date of birth, ID band Patient awake    Reviewed: Allergy & Precautions, H&P , NPO status , Patient's Chart, lab work & pertinent test results  Airway Mallampati: III TM Distance: >3 FB Neck ROM: full    Dental No notable dental hx.    Pulmonary neg pulmonary ROS,    Pulmonary exam normal       Cardiovascular hypertension, negative cardio ROS      Neuro/Psych negative neurological ROS  negative psych ROS   GI/Hepatic negative GI ROS, Neg liver ROS,   Endo/Other  Morbid obesity  Renal/GU negative Renal ROS  negative genitourinary   Musculoskeletal negative musculoskeletal ROS (+)   Abdominal Normal abdominal exam  (+)   Peds  Hematology negative hematology ROS (+)   Anesthesia Other Findings   Reproductive/Obstetrics (+) Pregnancy                           Anesthesia Physical Anesthesia Plan  ASA: III  Anesthesia Plan: Epidural   Post-op Pain Management:    Induction:   Airway Management Planned:   Additional Equipment:   Intra-op Plan:   Post-operative Plan:   Informed Consent: I have reviewed the patients History and Physical, chart, labs and discussed the procedure including the risks, benefits and alternatives for the proposed anesthesia with the patient or authorized representative who has indicated his/her understanding and acceptance.     Plan Discussed with:   Anesthesia Plan Comments:         Anesthesia Quick Evaluation

## 2012-04-10 NOTE — Progress Notes (Signed)
Dawn Dawson is a 27 y.o. G1P0 at [redacted]w[redacted]d by LMP admitted for induction of labor due to Pre-eclamptic toxemia of pregnancy..  Subjective:  Comfortable w epidural,   Objective: BP 114/56  Pulse 110  Temp 97.4 F (36.3 C) (Oral)  Resp 18  Ht 5\' 3"  (1.6 m)  Wt 315 lb (142.883 kg)  BMI 55.80 kg/m2  SpO2 99% I/O last 3 completed shifts: In: 480 [P.O.:480] Out: 425 [Urine:425] Total I/O In: 553.1 [I.V.:553.1] Out: 1750 [Urine:1750]  FHT:  FHR: 130 bpm, variability: moderate,  accelerations:  Present,  decelerations:  Present variable UC:   Have not been tracing well,  SVE:  5/100/-2 Foley bulb removed AROM for sm amt blood tinged fluid IUPC and FSE placed without difficulty    Assessment / Plan: Induction of labor due to pre-eclampsia,  progressing well on pitocin BP stable Output adequate   Labor: progressing, will titrate pitocin for adequate MVU's  Preeclampsia:  on magnesium sulfate Fetal Wellbeing:  Category I Pain Control:  Epidural I/D:  n/a Anticipated MOD:  NSVD  Updated Dr Su Hilt Will check mag level and repeat [facility] labs   Andrya Roppolo M 04/10/2012, 4:34 PM

## 2012-04-10 NOTE — Progress Notes (Signed)
This note also relates to the following rows which could not be included: Pulse Rate - Cannot attach notes to unvalidated device data SpO2 - Cannot attach notes to unvalidated device data  Turn epidural pump off for one hour and given pt 2cc's phenyleprhine per Dr. Arby Barrette

## 2012-04-10 NOTE — Progress Notes (Addendum)
Comfortable, agrees to cesarean section O BP 138/66  Pulse 106  Temp 97.6 F (36.4 C) (Oral)  Resp 16  Ht 5\' 3"  (1.6 m)  Wt 315 lb (142.883 kg)  BMI 55.80 kg/m2  SpO2 98%      fhts category 1      abd soft between uc      Contractions adequate      Vag 4 100 -2 R clear A [redacted]w[redacted]d induction HTN pre-eclampsia    anemia    Adequate uc since 1630 without cervical change P primary cesarean section, discussed risks bleeding, injury to surrounding organs, and infection, collaboration with Dr. Su Hilt. Lavera Guise, CNM  Agree with above.  I reviewed the r/b/a of the csection with the patient.  She verbalized understanding and consent signed and witnessed.  Questions answered and patient says she is ready to proceed.

## 2012-04-10 NOTE — Anesthesia Procedure Notes (Addendum)
Epidural Patient location during procedure: OB Start time: 04/10/2012 8:44 AM End time: 04/10/2012 9:00 AM  Staffing Anesthesiologist: Sandrea Hughs Performed by: anesthesiologist   Preanesthetic Checklist Completed: patient identified, site marked, surgical consent, pre-op evaluation, timeout performed, IV checked, risks and benefits discussed and monitors and equipment checked  Epidural Patient position: sitting Prep: site prepped and draped and DuraPrep Patient monitoring: continuous pulse ox and blood pressure Approach: midline Injection technique: LOR air  Needle:  Needle type: Tuohy  Needle gauge: 17 G Needle length: 9 cm and 9 Needle insertion depth: 9 cm Catheter type: closed end flexible Catheter size: 19 Gauge Catheter at skin depth: 11 cm Test dose: negative and Other  Assessment Events: blood aspirated, injection not painful, no injection resistance, negative IV test and no paresthesia  Additional Notes Positive aspiration on first attempt.Reason for block:procedure for pain  Spinal  Patient location during procedure: OR Start time: 04/10/2012 11:35 AM End time: 04/10/2012 11:45 AM Reason for block: procedure for pain Staffing Anesthesiologist: Sandrea Hughs Performed by: anesthesiologist  Preanesthetic Checklist Completed: patient identified, site marked, surgical consent, pre-op evaluation, timeout performed, IV checked, risks and benefits discussed and monitors and equipment checked Spinal Block Patient position: sitting Prep: DuraPrep Patient monitoring: heart rate, cardiac monitor, continuous pulse ox and blood pressure Approach: midline Location: L3-4 Injection technique: single-shot Needle Needle type: Tuohy  Needle gauge: 24 G Needle length: 10 cm Catheter size: 19 g Catheter at skin depth: 15 cm Assessment Sensory level: T12 Additional Notes Intentional continuous spinal as the patient is super morbidly obese  with a class 3 airway and preeclampsia. Prior attempts at epidural had failed.

## 2012-04-10 NOTE — Transfer of Care (Signed)
Immediate Anesthesia Transfer of Care Note  Patient: Dawn Dawson  Procedure(s) Performed: Procedure(s) (LRB) with comments: CESAREAN SECTION (N/A) - Primary cesarean section of baby girl at 2223 APGAR  Patient Location: PACU  Anesthesia Type:Spinal  Level of Consciousness: sedated  Airway & Oxygen Therapy: Patient Spontanous Breathing  Post-op Assessment: Report given to PACU RN and Post -op Vital signs reviewed and stable  Post vital signs: Reviewed and stable  Complications: No apparent anesthesia complications

## 2012-04-10 NOTE — Op Note (Signed)
Cesarean Section Procedure Note  Indications: 36 5/7wks CHTN with SI Preeclampsia undergoing induction with FTP at 5cm  Pre-operative Diagnosis: failure to progress   Post-operative Diagnosis: failure to progress  Procedure: CESAREAN SECTION  Surgeon: Purcell Nails, MD    Assistants: Lavera Guise, CNM  Anesthesia: Spinal  Anesthesiologist: Jiles Garter, MD   Procedure Details  The patient was taken to the operating room secondary to FTP after the risks, benefits, complications, treatment options, and expected outcomes were discussed with the patient.  The patient concurred with the proposed plan, giving informed consent which was signed and witnessed. The patient was taken to Operating Room C-section Suite, identified as Dawn Dawson and the procedure verified as C-Section Delivery. A Time Out was held and the above information confirmed.  After induction of anesthesia by obtaining a spinal, the patient was prepped and draped in the usual sterile manner. A Pfannenstiel skin incision was made and carried down through the subcutaneous tissue to the underlying layer of fascia.  The fascia was incised bilaterally and extended transversely bilaterally with the Mayo scissors. Kocher clamps were placed on the inferior aspect of the fascial incision and the underlying rectus muscle was separated from the fascia. The same was done on the superior aspect of the fascial incision.  The peritoneum was identified, entered bluntly and extended manually.  An Jon Gills was placed for self retaining retraction.  The utero-vesical peritoneal reflection was incised transversely and the bladder flap was bluntly freed from the lower uterine segment. A low transverse uterine incision was made with the scalpel and extended bilaterally with the bandage scissors.  The infant was delivered in vertex position without difficulty.  After the umbilical cord was clamped and cut, the infant was handed to the  awaiting pediatricians.  Cord blood was obtained for evaluation.  The placenta was removed intact and appeared to be within normal limits. The uterus was cleared of all clots and debris. The uterine incision was closed with running interlocking sutures of 0 Vicryl and a second imbricating layer was performed as well.   Closure was difficult secondary to patient having bouts of emesis.  Bilateral tubes and ovaries appeared to be within normal limits.  Good hemostasis was noted.  Copious irrigation was performed until clear.  The peritoneum was repaired with 2-0 chromic via a running suture.  The fascia was reapproximated with a running suture of 0 Vicryl. The subcutaneous tissue was reapproximated with 3 interrupted sutures of 2-0 plain and size 7 JP drain was placed.  The skin was reapproximated with a subcuticular suture of 3-0 monocryl.  Steristrips were applied.  Instrument, sponge, and needle counts were correct prior to abdominal closure and at the conclusion of the case.  The patient was awaiting transfer to the recovery room in good condition.  Findings: Live female infant with Apgars 8 at one minute and 8 at five minutes.  Normal appearing bilateral ovaries and fallopian tubes were noted.  Estimated Blood Loss:          Drains: foley to gravity with 40cc of concentrated urine         Total IV Fluids:         Specimens to Pathology: Placenta         Complications:  None; patient tolerated the procedure well.         Disposition: PACU - hemodynamically stable.         Condition: stable  Attending Attestation: I performed the  procedure.

## 2012-04-10 NOTE — Progress Notes (Signed)
KEYLAH DARWISH is a 27 y.o. G1P0 at [redacted]w[redacted]d by LMP admitted for induction of labor due to Pre-eclamptic toxemia of pregnancy..  Subjective: Sleeping now, snoring heavily  just now getting comfortable, had to replace epidural for a 3rd time, pt now has intrathecal  Has had some nausea with vomiting,   Objective: BP 104/48  Pulse 94  Temp 97.4 F (36.3 C) (Oral)  Resp 20  Ht 5\' 3"  (1.6 m)  Wt 315 lb (142.883 kg)  BMI 55.80 kg/m2  SpO2 97% I/O last 3 completed shifts: In: 480 [P.O.:480] Out: 425 [Urine:425] Total I/O In: 338.8 [I.V.:338.8] Out: 400 [Urine:400]   Filed Vitals:   04/10/12 1302 04/10/12 1304 04/10/12 1306 04/10/12 1311  BP: 120/53 100/60  104/48  Pulse: 103 95 84 94  Temp:      TempSrc:      Resp:      Height:      Weight:      SpO2:   97%      FHT:  FHR: 150 bpm, variability: minimal ,  accelerations:  Abscent,  decelerations:  Present late decels, variables  UC:   irregular, difficulty tracing, 5-8 min SVE:   Dilation: 3 Effacement (%): 60 Station: -3 Exam by:: Sanda Klein, CNM   Foley bulb still in place, vtx not palpable, previously confirmed vtx w BSUS   Labs: Lab Results  Component Value Date   WBC 11.1* 04/10/2012   HGB 9.6* 04/10/2012   HCT 29.4* 04/10/2012   MCV 79.0 04/10/2012   PLT 325 04/10/2012    Assessment / Plan: IUP at [redacted]w[redacted]d Severe pre-eclampsia  BP now decreased, ephedrine given IVF bolus given Repositioned to L side  Output adequate   Labor: foley bulb in, pitocin on 2mu Preeclampsia:  on magnesium sulfate Fetal Wellbeing:  Category II Pain Control:  Epidural I/D:  n/a Anticipated MOD:  NSVD  Notify anesthesia of symptomatic hypotension D/w Dr Evlyn Kanner M 04/10/2012, 1:15 PM

## 2012-04-11 ENCOUNTER — Inpatient Hospital Stay (HOSPITAL_COMMUNITY): Admission: RE | Admit: 2012-04-11 | Payer: BC Managed Care – PPO | Source: Ambulatory Visit

## 2012-04-11 ENCOUNTER — Encounter (HOSPITAL_COMMUNITY): Payer: Self-pay | Admitting: Obstetrics and Gynecology

## 2012-04-11 LAB — CBC
Hemoglobin: 8.4 g/dL — ABNORMAL LOW (ref 12.0–15.0)
MCHC: 33 g/dL (ref 30.0–36.0)
Platelets: 394 10*3/uL (ref 150–400)
RBC: 3.3 MIL/uL — ABNORMAL LOW (ref 3.87–5.11)
RDW: 14.9 % (ref 11.5–15.5)
WBC: 23.9 10*3/uL — ABNORMAL HIGH (ref 4.0–10.5)

## 2012-04-11 LAB — COMPREHENSIVE METABOLIC PANEL
AST: 42 U/L — ABNORMAL HIGH (ref 0–37)
Albumin: 1.8 g/dL — ABNORMAL LOW (ref 3.5–5.2)
BUN: 6 mg/dL (ref 6–23)
Calcium: 8.3 mg/dL — ABNORMAL LOW (ref 8.4–10.5)
Creatinine, Ser: 0.69 mg/dL (ref 0.50–1.10)
GFR calc non Af Amer: >90 mL/min (ref >90)

## 2012-04-11 LAB — URIC ACID: Uric Acid, Serum: 4.8 mg/dL (ref 2.4–7.0)

## 2012-04-11 LAB — LACTATE DEHYDROGENASE: LDH: 262 U/L — ABNORMAL HIGH (ref 94–250)

## 2012-04-11 LAB — MAGNESIUM: Magnesium: 3.6 mg/dL — ABNORMAL HIGH (ref 1.5–2.5)

## 2012-04-11 MED ORDER — DIPHENHYDRAMINE HCL 25 MG PO CAPS
25.0000 mg | ORAL_CAPSULE | ORAL | Status: DC | PRN
  Administered 2012-04-11: 25 mg via ORAL
  Filled 2012-04-11: qty 1

## 2012-04-11 MED ORDER — OXYCODONE-ACETAMINOPHEN 5-325 MG PO TABS
1.0000 | ORAL_TABLET | ORAL | Status: DC | PRN
  Administered 2012-04-11 (×2): 2 via ORAL
  Administered 2012-04-12 (×3): 1 via ORAL
  Administered 2012-04-12: 2 via ORAL
  Administered 2012-04-12 – 2012-04-13 (×4): 1 via ORAL
  Filled 2012-04-11: qty 1
  Filled 2012-04-11: qty 2
  Filled 2012-04-11 (×3): qty 1
  Filled 2012-04-11: qty 2
  Filled 2012-04-11 (×3): qty 1
  Filled 2012-04-11: qty 2

## 2012-04-11 MED ORDER — DIPHENHYDRAMINE HCL 25 MG PO CAPS: 25.0000 mg | ORAL_CAPSULE | Freq: Four times a day (QID) | ORAL | Status: DC | PRN

## 2012-04-11 MED ORDER — LACTATED RINGERS IV SOLN
INTRAVENOUS | Status: DC
  Administered 2012-04-11 – 2012-04-12 (×2): via INTRAVENOUS

## 2012-04-11 MED ORDER — METOCLOPRAMIDE HCL 5 MG/ML IJ SOLN: 10.0000 mg | Freq: Three times a day (TID) | INTRAMUSCULAR | Status: DC | PRN

## 2012-04-11 MED ORDER — ONDANSETRON HCL 4 MG/2ML IJ SOLN: 4.0000 mg | INTRAMUSCULAR | Status: DC | PRN

## 2012-04-11 MED ORDER — IBUPROFEN 600 MG PO TABS
600.0000 mg | ORAL_TABLET | Freq: Four times a day (QID) | ORAL | Status: DC | PRN
  Filled 2012-04-11 (×2): qty 1

## 2012-04-11 MED ORDER — DIPHENHYDRAMINE HCL 50 MG/ML IJ SOLN: 25.0000 mg | INTRAMUSCULAR | Status: DC | PRN

## 2012-04-11 MED ORDER — WITCH HAZEL-GLYCERIN EX PADS: 1.0000 "application " | MEDICATED_PAD | CUTANEOUS | Status: DC | PRN

## 2012-04-11 MED ORDER — LANOLIN HYDROUS EX OINT: 1.0000 "application " | TOPICAL_OINTMENT | CUTANEOUS | Status: DC | PRN

## 2012-04-11 MED ORDER — DIBUCAINE 1 % RE OINT: 1.0000 "application " | TOPICAL_OINTMENT | RECTAL | Status: DC | PRN

## 2012-04-11 MED ORDER — MENTHOL 3 MG MT LOZG: 1.0000 | LOZENGE | OROMUCOSAL | Status: DC | PRN

## 2012-04-11 MED ORDER — SODIUM CHLORIDE 0.9 % IJ SOLN: 3.0000 mL | INTRAMUSCULAR | Status: DC | PRN

## 2012-04-11 MED ORDER — NALOXONE HCL 1 MG/ML IJ SOLN
1.0000 ug/kg/h | INTRAVENOUS | Status: DC | PRN
  Filled 2012-04-11: qty 2

## 2012-04-11 MED ORDER — PRENATAL MULTIVITAMIN CH
1.0000 | ORAL_TABLET | Freq: Every day | ORAL | Status: DC
  Administered 2012-04-11 – 2012-04-13 (×3): 1 via ORAL
  Filled 2012-04-11 (×3): qty 1

## 2012-04-11 MED ORDER — NALBUPHINE HCL 10 MG/ML IJ SOLN
5.0000 mg | INTRAMUSCULAR | Status: DC | PRN
  Filled 2012-04-11: qty 1

## 2012-04-11 MED ORDER — KETOROLAC TROMETHAMINE 30 MG/ML IJ SOLN: 30.0000 mg | Freq: Four times a day (QID) | INTRAMUSCULAR | Status: AC | PRN

## 2012-04-11 MED ORDER — SENNOSIDES-DOCUSATE SODIUM 8.6-50 MG PO TABS
2.0000 | ORAL_TABLET | Freq: Every day | ORAL | Status: DC
  Administered 2012-04-11 – 2012-04-12 (×2): 2 via ORAL

## 2012-04-11 MED ORDER — ONDANSETRON HCL 4 MG/2ML IJ SOLN: 4.0000 mg | Freq: Three times a day (TID) | INTRAMUSCULAR | Status: DC | PRN

## 2012-04-11 MED ORDER — IBUPROFEN 600 MG PO TABS
600.0000 mg | ORAL_TABLET | Freq: Four times a day (QID) | ORAL | Status: DC
  Administered 2012-04-11 – 2012-04-13 (×10): 600 mg via ORAL
  Filled 2012-04-11 (×8): qty 1

## 2012-04-11 MED ORDER — SIMETHICONE 80 MG PO CHEW
80.0000 mg | CHEWABLE_TABLET | Freq: Three times a day (TID) | ORAL | Status: DC
  Administered 2012-04-11 – 2012-04-13 (×9): 80 mg via ORAL

## 2012-04-11 MED ORDER — ZOLPIDEM TARTRATE 5 MG PO TABS: 5.0000 mg | ORAL_TABLET | Freq: Every evening | ORAL | Status: DC | PRN

## 2012-04-11 MED ORDER — ONDANSETRON HCL 4 MG PO TABS: 4.0000 mg | ORAL_TABLET | ORAL | Status: DC | PRN

## 2012-04-11 MED ORDER — ACETAMINOPHEN 500 MG PO TABS
1000.0000 mg | ORAL_TABLET | Freq: Four times a day (QID) | ORAL | Status: DC | PRN
  Administered 2012-04-11: 1000 mg via ORAL

## 2012-04-11 MED ORDER — FENTANYL CITRATE 0.05 MG/ML IJ SOLN
INTRAMUSCULAR | Status: AC
  Filled 2012-04-11: qty 2

## 2012-04-11 MED ORDER — SIMETHICONE 80 MG PO CHEW: 80.0000 mg | CHEWABLE_TABLET | ORAL | Status: DC | PRN

## 2012-04-11 MED ORDER — OXYTOCIN 40 UNITS IN LACTATED RINGERS INFUSION - SIMPLE MED
62.5000 mL/h | INTRAVENOUS | Status: AC
  Administered 2012-04-11: 62.5 mL/h via INTRAVENOUS
  Filled 2012-04-11: qty 1000

## 2012-04-11 MED ORDER — FUROSEMIDE 10 MG/ML IJ SOLN
20.0000 mg | Freq: Once | INTRAMUSCULAR | Status: AC
  Administered 2012-04-11: 20 mg via INTRAVENOUS
  Filled 2012-04-11: qty 2

## 2012-04-11 MED ORDER — TETANUS-DIPHTH-ACELL PERTUSSIS 5-2.5-18.5 LF-MCG/0.5 IM SUSP
0.5000 mL | Freq: Once | INTRAMUSCULAR | Status: AC
  Administered 2012-04-11: 0.5 mL via INTRAMUSCULAR
  Filled 2012-04-11: qty 0.5

## 2012-04-11 MED ORDER — NALOXONE HCL 0.4 MG/ML IJ SOLN: 0.4000 mg | INTRAMUSCULAR | Status: DC | PRN

## 2012-04-11 MED ORDER — DIPHENHYDRAMINE HCL 50 MG/ML IJ SOLN: 12.5000 mg | INTRAMUSCULAR | Status: DC | PRN

## 2012-04-11 NOTE — Anesthesia Postprocedure Evaluation (Signed)
  Anesthesia Post-op Note  Patient: Dawn Dawson  Procedure(s) Performed: Procedure(s) (LRB) with comments: CESAREAN SECTION (N/A) - Primary cesarean section of baby girl at 2223 APGAR 8/8  Patient Location: A-ICU  Anesthesia Type:Spinal  Level of Consciousness: awake  Airway and Oxygen Therapy: Patient Spontanous Breathing  Post-op Pain: mild  Post-op Assessment: Patient's Cardiovascular Status Stable and Respiratory Function Stable  Post-op Vital Signs: stable  Complications: No apparent anesthesia complications

## 2012-04-11 NOTE — Addendum Note (Signed)
Addendum  created 04/11/12 0731 by Cianna Kasparian L Shealee Yordy, CRNA   Modules edited:Notes Section    

## 2012-04-11 NOTE — Progress Notes (Signed)

## 2012-04-11 NOTE — Anesthesia Postprocedure Evaluation (Signed)
  Anesthesia Post-op Note  Patient: Dawn Dawson  Procedure(s) Performed: Procedure(s) (LRB) with comments: CESAREAN SECTION (N/A) - Primary cesarean section of baby girl at 2223 APGAR 8/8  Patient is awake, responsive, moving her legs, and has signs of resolution of her numbness. Pain and nausea are reasonably well controlled. Vital signs are stable and clinically acceptable. Oxygen saturation is clinically acceptable. There are no apparent anesthetic complications at this time. Patient is ready for discharge.

## 2012-04-11 NOTE — Progress Notes (Signed)
UR chart review completed.  

## 2012-04-11 NOTE — Progress Notes (Signed)
Patient ID: Dawn Dawson, female   DOB: Jun 25, 1985, 27 y.o.   MRN: [id number]  Subjective: Postpartum Day 1: Cesarean Delivery for FTP after IOL for pre-eclampsia Patient reports that pain is well-managed.Lochia normal. Sitting by the side of the bed and tolerating diet as ordered without difficulty. No flatus. Absent bowel movement. Denies symptoms of pre-eclampsia. Bottle feeding but considering BF  Objective: Vital signs in last 24 hours: Temp:  [97.4 F (36.3 C)-98.9 F (37.2 C)] 98.3 F (36.8 C) (01/20 0800) Pulse Rate:  [25-111] 98  (01/20 1000) Resp:  [15-45] 38  (01/20 0700) BP: (100-161)/(41-138) 141/96 mmHg (01/20 1000) SpO2:  [78 %-100 %] 100 % (01/20 1000) Weight:  [311 lb 4.8 oz (141.205 kg)] 311 lb 4.8 oz (141.205 kg) (01/20 0208)  Physical Exam:  General: alert Lungs: clear Lochia: appropriate Uterine Fundus: firm and appropriately tender at 0/0 and wound covered with honeycomb dressing DVT Evaluation: No evidence of DVT seen on physical exam. Edema 3+   Basename 04/11/12 0540 04/11/12 0045  HGB 8.4* 9.5*  HCT 25.9* 28.8*   AST 42 (62) ALT 40 (53)  Urine output approximately 100 cc/hr still concentrated  I/O= -500 cc today  Assessment/Plan: Status post Cesarean section. Doing well postoperatively.  Pre-eclampsia with improving labs on Magnesium Sulfate Continue current care. May add antihypertensive if BP remains elevated Plan reviewed with patient  Dawn Dawson A 04/11/2012, 10:59 AM

## 2012-04-12 DIAGNOSIS — Z98891 History of uterine scar from previous surgery: Secondary | ICD-10-CM | POA: Diagnosis not present

## 2012-04-12 LAB — COMPREHENSIVE METABOLIC PANEL
ALT: 35 U/L (ref 0–35)
AST: 38 U/L — ABNORMAL HIGH (ref 0–37)
Alkaline Phosphatase: 171 U/L — ABNORMAL HIGH (ref 39–117)
Calcium: 7.4 mg/dL — ABNORMAL LOW (ref 8.4–10.5)
GFR calc Af Amer: >90 mL/min (ref >90)
Glucose, Bld: 96 mg/dL (ref 70–99)
Potassium: 3.5 mEq/L (ref 3.5–5.1)
Sodium: 136 mEq/L (ref 135–145)
Total Protein: 5.9 g/dL — ABNORMAL LOW (ref 6.0–8.3)

## 2012-04-12 LAB — CBC
Hemoglobin: 7.1 g/dL — ABNORMAL LOW (ref 12.0–15.0)
MCH: 25.6 pg — ABNORMAL LOW (ref 26.0–34.0)
MCHC: 32.3 g/dL (ref 30.0–36.0)
Platelets: 332 10*3/uL (ref 150–400)
RBC: 2.77 MIL/uL — ABNORMAL LOW (ref 3.87–5.11)

## 2012-04-12 MED ORDER — SERTRALINE HCL 50 MG PO TABS
50.0000 mg | ORAL_TABLET | Freq: Every day | ORAL | Status: DC
  Administered 2012-04-12 – 2012-04-13 (×2): 50 mg via ORAL
  Filled 2012-04-12 (×3): qty 1

## 2012-04-12 MED ORDER — NIFEDIPINE ER 30 MG PO TB24
30.0000 mg | ORAL_TABLET | Freq: Every day | ORAL | Status: DC
  Administered 2012-04-12: 30 mg via ORAL
  Filled 2012-04-12 (×2): qty 1

## 2012-04-12 MED ORDER — NIFEDIPINE ER 60 MG PO TB24
60.0000 mg | ORAL_TABLET | Freq: Every day | ORAL | Status: DC
  Administered 2012-04-13: 60 mg via ORAL
  Filled 2012-04-12 (×2): qty 1

## 2012-04-12 MED ORDER — HYDROCHLOROTHIAZIDE 25 MG PO TABS
25.0000 mg | ORAL_TABLET | Freq: Every day | ORAL | Status: DC
  Administered 2012-04-12 – 2012-04-13 (×2): 25 mg via ORAL
  Filled 2012-04-12 (×3): qty 1

## 2012-04-12 MED ORDER — SODIUM CHLORIDE 0.9 % IJ SOLN
3.0000 mL | Freq: Two times a day (BID) | INTRAMUSCULAR | Status: DC
  Administered 2012-04-12: 3 mL via INTRAVENOUS

## 2012-04-12 MED ORDER — NIFEDIPINE ER 30 MG PO TB24
30.0000 mg | ORAL_TABLET | Freq: Once | ORAL | Status: AC
  Administered 2012-04-12: 30 mg via ORAL
  Filled 2012-04-12: qty 1

## 2012-04-12 NOTE — Progress Notes (Signed)
Pt tranferred ambulatory to Rm #[phone]

## 2012-04-12 NOTE — Progress Notes (Signed)
Subjective: Postpartum Day 2: Cesarean Delivery Patient reports tolerating PO, + flatus.  She denies having any headaches, blurred vision or RUQ pain.  She has been anxious and was on zoloft before but stopped it in the hospital  Objective: Vital signs in last 24 hours: Temp:  [97.9 F (36.6 C)-98.2 F (36.8 C)] 98.1 F (36.7 C) (01/21 0745) Pulse Rate:  [87-111] 91  (01/21 1111) Resp:  [20-28] 20  (01/21 1115) BP: (118-157)/(46-88) 135/80 mmHg (01/21 1111) SpO2:  [96 %-100 %] 100 % (01/21 1111) Weight:  [312 lb 1.6 oz (141.568 kg)] 312 lb 1.6 oz (141.568 kg) (01/21 1610)  Physical Exam:  General: alert CV RRR Lungs CTAB Lochia: appropriate Uterine Fundus: firm Incision: healing well DVT Evaluation: No evidence of DVT seen on physical exam.   Basename 04/12/12 0500 04/11/12 0540  HGB 7.1* 8.4*  HCT 22.0* 25.9*    Assessment/Plan: Status post Cesarean section. Doing well postoperatively.  Preeclampsia.  Labs are improving.  UO has increased after lasix.  Will d/c Magnesium and start procardia and HCTZ CONTINUE I/OS If stable will transfer to the floor Continue current care.  Casi Westerfeld A 04/12/2012, 11:39 AM

## 2012-04-13 LAB — TYPE AND SCREEN
ABO/RH(D): A POS
Antibody Screen: NEGATIVE
Unit division: 0

## 2012-04-13 MED ORDER — NIFEDIPINE ER 60 MG PO TB24: 60.0000 mg | ORAL_TABLET | Freq: Every day | ORAL | Status: DC

## 2012-04-13 MED ORDER — OXYCODONE-ACETAMINOPHEN 5-325 MG PO TABS
1.0000 | ORAL_TABLET | Freq: Four times a day (QID) | ORAL | Status: DC | PRN
Start: 2012-04-13 — End: 2020-08-15

## 2012-04-13 MED ORDER — IBUPROFEN 600 MG PO TABS: 600.0000 mg | ORAL_TABLET | Freq: Four times a day (QID) | ORAL | Status: DC | PRN

## 2012-04-13 MED ORDER — HYDROCHLOROTHIAZIDE 25 MG PO TABS: 25.0000 mg | ORAL_TABLET | Freq: Every day | ORAL | Status: DC

## 2012-04-13 MED ORDER — SERTRALINE HCL 50 MG PO TABS: 50.0000 mg | ORAL_TABLET | Freq: Every day | ORAL | Status: DC

## 2012-04-13 NOTE — Discharge Summary (Signed)
Obstetric Discharge Summary Reason for Admission: induction of labor Prenatal Procedures: NST, Preeclampsia and ultrasound Intrapartum Procedures: Primary Low Transverse C-Section Postpartum Procedures: MgSulfate for seizure prophylaxis for preeclampsia and lasix Complications-Operative and Postpartum: none Hemoglobin  Date Value Range Status  04/12/2012 7.1* 12.0 - 15.0 g/dL Final     HCT  Date Value Range Status  04/12/2012 22.0* 36.0 - 46.0 % Final    Physical Exam:  General: alert and no distress Lochia: appropriate Uterine Fundus: firm Incision: healing well, dressing removed, steristrips intact and JP drain dressed with steristrip DVT Evaluation: No evidence of DVT seen on physical exam.  Discharge Diagnoses: Preelampsia s/p csection with improving LFTs but still slightly elevated  Discharge Information: Date: 04/13/2012 Activity: pelvic rest Diet: routine Medications: Ibuprofen, Percocet and zoloft, procardia and hctz Condition: improved Instructions: refer to practice specific booklet Discharge to: home Follow-up Information    Follow up with [facility]. Schedule an appointment as soon as possible for a visit on 04/18/2012. (for BP check and recheck LFTs)    Contact information:   [address] [place] [address] [phone]         Newborn Data: Live born female  Birth Weight: 6 lb 2.4 oz (2790 g) APGAR: 8, 8  Home with mother.  Purcell Nails 04/13/2012, 5:35 PM

## 2012-04-13 NOTE — Progress Notes (Addendum)
Subjective: Postpartum Day 3: Cesarean Delivery Patient reports tolerating PO, + flatus and no problems voiding.  Reports a little mucus but she reported having a slight cold.  Denies HA, visual changes or RUQ pain.  Anticipating d/c today.  Objective: Vital signs in last 24 hours: Temp:  [97.4 F (36.3 C)-98.5 F (36.9 C)] 98.5 F (36.9 C) (01/22 0530) Pulse Rate:  [85-112] 105  (01/22 0530) Resp:  [18-20] 20  (01/22 0530) BP: (118-177)/(66-102) 154/95 mmHg (01/22 0530) SpO2:  [92 %-100 %] 96 % (01/21 2255) JP drain minimal output overnight  Physical Exam:  General: alert and no distress Lochia: appropriate Uterine Fundus: firm Incision: dressing intact DVT Evaluation: No evidence of DVT seen on physical exam.   Basename 04/12/12 0500 04/11/12 0540  HGB 7.1* 8.4*  HCT 22.0* 25.9*    Assessment/Plan: Status post Cesarean section. Postoperative course complicated by Preeclampsia s/p Mg with improved UOP now.  BP still slightly elevated and procardia increased to 60mg  last night.  Will cont to monitor BPs through the afternoon and if stable or improved, will d/c home later today.  Will d/c JP drain prior to d/c home. Rxs to go home with are ibuprofen, percocet, procardia 60mg  qd, hctz 25mg  qd and zoloft 50mg  qd. Will have smart start nurse go to home within 24-48hrs and rto in 1wk for BP check and recheck LFTs. Actually patient lives in [place], so will have her rto on Monday for BP check and recheck LFTs. Continue current care.  Ran Tullis Y 04/13/2012, 9:12 AM  Filed Vitals:   04/13/12 1429  BP: 138/83  Pulse: 111  Temp: 98.3 F (36.8 C)  Resp: 20   BP controlled on 60mg  of procardia and hctz 25mg .  Will d/c home.  D/c instructions discussed.  Pt will call office for visit on Monday.

## 2012-04-14 ENCOUNTER — Telehealth: Payer: Self-pay | Admitting: Obstetrics and Gynecology

## 2012-04-14 NOTE — Telephone Encounter (Signed)
VM from DR AR. Pt D/C'd 04/13/12.  Had slightly elevated liver enzymes. Needs BP check and repeat LFT's Monday.

## 2012-04-14 NOTE — Telephone Encounter (Signed)
Tc to pt regarding msg from AR, pt needing a BP check and check LFTs, lm on vm to call back.

## 2012-04-18 ENCOUNTER — Other Ambulatory Visit: Payer: BC Managed Care – PPO

## 2012-04-18 LAB — HEPATIC FUNCTION PANEL
Albumin: 2.9 g/dL — ABNORMAL LOW (ref 3.5–5.2)
Alkaline Phosphatase: 187 U/L — ABNORMAL HIGH (ref 39–117)
Total Protein: 7.3 g/dL (ref 6.0–8.3)

## 2012-04-18 MED ORDER — HYDROCHLOROTHIAZIDE 25 MG PO TABS: 25.0000 mg | ORAL_TABLET | Freq: Every day | ORAL | Status: DC

## 2012-04-18 NOTE — Progress Notes (Unsigned)
Pt here in office today for lab per AR pt Got LFT's and BP check 142/72-l, 144/68-r, pt weight is 291.  Consulted with [facility] inst pt to contine meds until post partum visit.

## 2012-05-20 ENCOUNTER — Telehealth: Payer: Self-pay | Admitting: Obstetrics and Gynecology

## 2012-05-23 ENCOUNTER — Encounter: Payer: BC Managed Care – PPO | Admitting: Obstetrics and Gynecology

## 2012-05-26 ENCOUNTER — Encounter: Payer: BC Managed Care – PPO | Admitting: Obstetrics and Gynecology

## 2012-06-16 ENCOUNTER — Other Ambulatory Visit: Payer: Self-pay | Admitting: Obstetrics and Gynecology

## 2012-06-18 LAB — PAP IG, CT-NG, RFX HPV ASCU

## 2014-01-22 ENCOUNTER — Encounter (HOSPITAL_COMMUNITY): Payer: Self-pay | Admitting: Obstetrics and Gynecology

## 2016-06-08 ENCOUNTER — Other Ambulatory Visit (HOSPITAL_COMMUNITY): Payer: Self-pay | Admitting: Surgery

## 2016-06-29 ENCOUNTER — Ambulatory Visit (HOSPITAL_COMMUNITY)
Admission: RE | Admit: 2016-06-29 | Discharge: 2016-06-29 | Disposition: A | Discharge status: Home / Self Care | Payer: Self-pay | Source: Ambulatory Visit | Attending: Surgery | Admitting: Surgery

## 2016-06-29 ENCOUNTER — Other Ambulatory Visit (HOSPITAL_COMMUNITY): Payer: Self-pay

## 2016-07-23 ENCOUNTER — Ambulatory Visit: Payer: Self-pay | Admitting: Skilled Nursing Facility1

## 2016-07-30 ENCOUNTER — Encounter: Payer: Self-pay | Admitting: Registered"

## 2016-07-30 ENCOUNTER — Encounter: Payer: 59 | Attending: Surgery | Admitting: Registered"

## 2016-07-30 DIAGNOSIS — E669 Obesity, unspecified: Secondary | ICD-10-CM

## 2016-07-30 DIAGNOSIS — Z713 Dietary counseling and surveillance: Secondary | ICD-10-CM | POA: Insufficient documentation

## 2016-07-30 DIAGNOSIS — Z6841 Body Mass Index (BMI) 40.0 and over, adult: Secondary | ICD-10-CM | POA: Insufficient documentation

## 2016-07-30 NOTE — Progress Notes (Signed)
Pre-Op Assessment Visit:  Pre-Operative Sleeve Gastrectomy Surgery  Medical Nutrition Therapy:  Appt start time: 2:20  End time: 3:15  Patient was seen on 07/30/2016 for Pre-Operative Nutrition Assessment. Assessment and [facility] on 07/30/2016.   Pt expectation of surgery: decrease blood pressure, prevent diabetes, feel better, more active and comfortable with 31 year old  Pt expectation of Dietitian: help with substituting favorites with healthier options, help with discipline  Start weight at [facility]: 295.7 BMI: 52.38   Pt arrived 20 min late due to getting lost on the way coming from [place]. Pt reports having a 33 year old daughter and wants to be healthy for her. 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 Pt is having her pinning ceremony this weekend.   Pt states her insurance requires 6 SWL visits prior to surgery.   24 hr Dietary Recall: First Meal: Slim fast shake Snack: none Second Meal: 1/2 chicken, rice, and cheese from [place]  Snack: peanuts, popcorn  Third Meal: 1/2 [place] meal Snack: cookie cake Beverages: water, coffee  Encouraged to engage in 150 minutes of moderate physical activity including cardiovascular and weight baring weekly  Handouts given during visit include:  . Pre-Op Goals . Bariatric Surgery Protein Shakes . Vitamin and Mineral Recommendations  During the appointment today the following Pre-Op Goals were reviewed with the patient: . Maintain or lose weight as instructed by your surgeon . Make healthy food choices . Begin to limit portion sizes . Limited concentrated sugars and fried foods . Keep fat/sugar in the single digits per serving on food labels . Practice CHEWING your food  (aim for 30 chews per bite or until applesauce consistency) . Practice not drinking 15 minutes before, during, and 30 minutes after each meal/snack . Avoid all  carbonated beverages  . Avoid/limit caffeinated beverages  . Avoid all sugar-sweetened beverages . Consume 3 meals per day; eat every 3-5 hours . Make a list of non-food related activities . Aim for 64-100 ounces of FLUID daily  . Aim for at least 60-80 grams of PROTEIN daily . Look for a liquid protein source that contain ?15 g protein and ?5 g carbohydrate  (ex: shakes, drinks, shots)   -Follow diet recommendations listed below  Energy and Macronutrient Recomendations: Calories: 1800 Carbohydrate: 200  Protein: 135 Fat: 50  Demonstrated degree of understanding via:  Teach Back   Teaching Method Utilized:  Visual Auditory Hands on  Barriers to learning/adherence to lifestyle change: none  Patient to call the Nutrition and Diabetes Education Services to enroll in Pre-Op and Post-Op Nutrition Education when surgery date is scheduled.

## 2016-08-04 ENCOUNTER — Encounter (HOSPITAL_COMMUNITY): Payer: Self-pay | Admitting: Radiology

## 2016-08-04 ENCOUNTER — Other Ambulatory Visit: Payer: Self-pay

## 2016-08-04 ENCOUNTER — Ambulatory Visit (HOSPITAL_COMMUNITY)
Admission: RE | Admit: 2016-08-04 | Discharge: 2016-08-04 | Disposition: A | Discharge status: Home / Self Care | Payer: 59 | Source: Ambulatory Visit | Attending: Surgery | Admitting: Surgery

## 2016-08-04 DIAGNOSIS — K449 Diaphragmatic hernia without obstruction or gangrene: Secondary | ICD-10-CM | POA: Diagnosis not present

## 2016-08-04 DIAGNOSIS — Z6841 Body Mass Index (BMI) 40.0 and over, adult: Secondary | ICD-10-CM | POA: Diagnosis not present

## 2016-08-04 DIAGNOSIS — Z01810 Encounter for preprocedural cardiovascular examination: Secondary | ICD-10-CM | POA: Insufficient documentation

## 2016-08-04 DIAGNOSIS — Z9049 Acquired absence of other specified parts of digestive tract: Secondary | ICD-10-CM | POA: Insufficient documentation

## 2016-08-04 DIAGNOSIS — Z01812 Encounter for preprocedural laboratory examination: Secondary | ICD-10-CM | POA: Diagnosis present

## 2016-09-29 ENCOUNTER — Encounter: Payer: Self-pay | Admitting: Registered"

## 2016-09-29 ENCOUNTER — Encounter: Payer: 59 | Attending: Surgery | Admitting: Registered"

## 2016-09-29 DIAGNOSIS — Z713 Dietary counseling and surveillance: Secondary | ICD-10-CM | POA: Diagnosis not present

## 2016-09-29 DIAGNOSIS — Z6841 Body Mass Index (BMI) 40.0 and over, adult: Secondary | ICD-10-CM | POA: Diagnosis not present

## 2016-09-29 DIAGNOSIS — E669 Obesity, unspecified: Secondary | ICD-10-CM

## 2016-09-29 NOTE — Progress Notes (Signed)
Appt start time: 12:03 end time: 12:22  Assessment: 1st SWL Appointment.   Start Wt at [facility]: 295.7 Wt: 294.1 BMI: 52.10   Pt arrives with 31 year old daughter having lost 1 lb from previous visit. Pt states she will be working 12hr shifts during 3rd shift hrs (7p-7a) butu currently training daytime hrs 7a-7p. Pt states she does not drink soda or juice but sweets are her weakness. Pt reports she has stopped drinking Slim Fast and is ready to find a suffifcient protein shake. Pt states she snacks a lot.    MEDICATIONS: See list   DIETARY INTAKE:  24-hr recall:  B ( AM):  Cereal (honey oats) Snk ( AM): greek yogurt L ( PM): [employer]-grilled nuggets, fruit cup Snk ( PM): none D ( PM): baked chicken wings, steamed broccoli, mac and cheese Snk ( PM): nuts, tuna packets, saltine crackers Beverages: water (38oz), coffee  Usual physical activity: walking  Diet to Follow: Calories: 1800 cals Carbohydrate: 200g  Protein: 135g Fat: 50g  Preferred Learning Style:   No preference indicated   Learning Readiness:   Not ready  Contemplating  Ready  Change in progress     Nutritional Diagnosis:  [place]-3.3 Overweight/obesity related to past poor dietary habits and physical inactivity as evidenced by patient w/ planned sleeve gastrectomy surgery following dietary guidelines for continued weight loss.    Intervention:  Nutrition counseling for upcoming Bariatric Surgery.  Goals:  - Find a protein shake that you enjoy with 15g or more protein and 5g or less of carbohydrates.  - Continue to eat 3 meals/day and include snacks.  Teaching Method Utilized:  Visual Auditory  Handouts given during visit include:  Protein Shakes   Barriers to learning/adherence to lifestyle change: none  Demonstrated degree of understanding via:  Teach Back   Monitoring/Evaluation:  Dietary intake, exercise, and body weight in 1 month(s).

## 2016-09-29 NOTE — Patient Instructions (Addendum)
-   Find a protein shake that you enjoy with 15g or more protein and 5g or less of carbohydrates.   - Continue to eat 3 meals/day and include snacks.

## 2016-10-26 ENCOUNTER — Encounter: Payer: 59 | Attending: Surgery | Admitting: Skilled Nursing Facility1

## 2016-10-26 DIAGNOSIS — Z713 Dietary counseling and surveillance: Secondary | ICD-10-CM | POA: Insufficient documentation

## 2016-10-26 DIAGNOSIS — Z6841 Body Mass Index (BMI) 40.0 and over, adult: Secondary | ICD-10-CM | POA: Insufficient documentation

## 2016-10-26 DIAGNOSIS — E669 Obesity, unspecified: Secondary | ICD-10-CM

## 2016-10-26 NOTE — Progress Notes (Signed)
Appt start time: 12:03 end time: 12:22  Assessment: 2nd SWL Appointment.   Start Wt at [facility]: 295.7 Wt: 292 BMI: 51.74   Pt arrives with 33 year old daughter having lost 2 lb from previous visit.  Pt states she is currently taking an antibiotic for a UTI. Pt states she has not tried protein shakes yet but has one powder in mind. Pt states she is tracking her calories with my fitness pal:1500-1700 calories. Pt states she is doing better with sweets and states french fries are her weakness. Just finished with he RN. Pt arrives with her husband and they workout together and states he is a great support. Pt states she now works first shift.  MEDICATIONS: See list   DIETARY INTAKE:  24-hr recall: working first shift now B ( AM):  Cereal (honey oats)----fast food oatmeal or egg mcmuffin Snk ( AM): greek yogurt---none L ( PM): [employer]-grilled nuggets, fruit cup---salad and pizza---french fries and wingetts  Snk ( PM): leftovers from lunch D ( PM): baked chicken wings, steamed broccoli, mac and cheese Snk ( PM): nuts, tuna packets, saltine crackers Beverages: water (38oz), coffee with cream and sugar  Usual physical activity: walking and at the gym 3 days a week (45 minutes)  Diet to Follow: Calories: 1800 cals Carbohydrate: 200g  Protein: 135g Fat: 50g  Preferred Learning Style:   No preference indicated   Learning Readiness:   Not ready  Contemplating  Ready  Change in progress  Nutritional Diagnosis:  [place]-3.3 Overweight/obesity related to past poor dietary habits and physical inactivity as evidenced by patient w/ planned sleeve gastrectomy surgery following dietary guidelines for continued weight loss.    Intervention:  Nutrition counseling for upcoming Bariatric Surgery.  Goals:  - Find a protein shake that you enjoy with 15g or more protein and 5g or less of carbohydrates: check your label on your powder  - Continue to eat 3 meals/day and include snacks. -Get to the  gym 3 days a week -Try splenda in your coffee  Teaching Method Utilized:  Visual Auditory  Handouts given during visit include:  Protein Shakes   Barriers to learning/adherence to lifestyle change: none  Demonstrated degree of understanding via:  Teach Back   Monitoring/Evaluation:  Dietary intake, exercise, and body weight in 1 month(s).

## 2016-11-26 ENCOUNTER — Ambulatory Visit: Payer: 59 | Admitting: Registered"

## 2016-11-30 ENCOUNTER — Ambulatory Visit: Payer: Self-pay | Admitting: Registered"

## 2016-12-07 ENCOUNTER — Ambulatory Visit: Payer: Self-pay | Admitting: Skilled Nursing Facility1

## 2016-12-09 ENCOUNTER — Encounter: Payer: 59 | Attending: Surgery | Admitting: Skilled Nursing Facility1

## 2016-12-09 ENCOUNTER — Encounter: Payer: Self-pay | Admitting: Skilled Nursing Facility1

## 2016-12-09 DIAGNOSIS — Z6841 Body Mass Index (BMI) 40.0 and over, adult: Secondary | ICD-10-CM | POA: Diagnosis not present

## 2016-12-09 DIAGNOSIS — Z713 Dietary counseling and surveillance: Secondary | ICD-10-CM | POA: Diagnosis present

## 2016-12-09 DIAGNOSIS — E669 Obesity, unspecified: Secondary | ICD-10-CM

## 2016-12-09 NOTE — Progress Notes (Signed)
Appt start time: 12:03 end time: 12:22  Assessment: 3rd SWL Appointment.   Start Wt at [facility]: 295.7 Wt: 293 BMI: 52.01  Pt arrives having gained about 1 pound. Pt states she has not been consistent with getting to the gym. Pt has different jobs some working nights some being on first shift. Pt states she lies the premier protein shakes. Pt states she will not buy sweets so she will not eat them. Pt states she has been doing better with chewing and eating slowly. Pt states trying to prepare meals for her family has been a Trigger for failure as a wife and as a mother and discourages her.  MEDICATIONS: See list   DIETARY INTAKE:  24-hr recall:  B ( AM):  [employer] Snk ( AM): greek yogurt---none L ( PM): taco bell or cook out Snk ( PM): leftovers from lunch D ( PM): baked chicken wings, steamed broccoli, mac and cheese Snk ( PM): nuts, tuna packets, saltine crackers Beverages: water (38oz), coffee with cream and sugar  Usual physical activity: ADL's  Diet to Follow: Calories: 1800 cals Carbohydrate: 200g  Protein: 135g Fat: 50g  Preferred Learning Style:   No preference indicated   Learning Readiness:   Not ready  Contemplating  Ready  Change in progress  Nutritional Diagnosis:  [place]-3.3 Overweight/obesity related to past poor dietary habits and physical inactivity as evidenced by patient w/ planned sleeve gastrectomy surgery following dietary guidelines for continued weight loss.    Intervention:  Nutrition counseling for upcoming Bariatric Surgery.  Goals:  -Prepare meals so as to eat less fast food, use the meal ideas sheet  Teaching Method Utilized:  Visual Auditory  Handouts given during visit include:  Protein Shakes   Barriers to learning/adherence to lifestyle change: none  Demonstrated degree of understanding via:  Teach Back   Monitoring/Evaluation:  Dietary intake, exercise, and body weight in 1 month(s).

## 2017-01-07 ENCOUNTER — Encounter: Payer: 59 | Attending: Surgery | Admitting: Skilled Nursing Facility1

## 2017-01-07 DIAGNOSIS — Z6841 Body Mass Index (BMI) 40.0 and over, adult: Secondary | ICD-10-CM | POA: Insufficient documentation

## 2017-01-07 DIAGNOSIS — Z713 Dietary counseling and surveillance: Secondary | ICD-10-CM | POA: Insufficient documentation

## 2017-01-07 DIAGNOSIS — E669 Obesity, unspecified: Secondary | ICD-10-CM

## 2017-01-07 NOTE — Progress Notes (Signed)
Appt start time: 12:03 end time: 12:22  Assessment: 4th SWL Appointment.   Start Wt at [facility]: 295.7 Wt: 298.11 BMI: 52.91   Pts husband states the pt has been cooking! Pt states she has been taking a water supplement.Pt states she has been trying to stay within 1500-2000 and walking more. Pt states she is back at night shift. Pt states she likes the premier protein. Pt states she tries to think more about her foods before she eats. Pt states she has been working on being more aware of her food choices. Dietitian and pt had an open and honest conversation about the needs for surgery and the lifestyle changes needed including a focus on whole foods rather than herbalife shakes.   MEDICATIONS: See list   DIETARY INTAKE:  24-hr recall:  B ( AM):  herbalife shake and almond milk Snk ( AM): greek yogurt---none L ( PM): chicken sald on a mini roll Snk ( PM): leftovers from lunch D ( PM): baked chicken wings, steamed broccoli, mac and cheese or baked chicken and tossed salad Snk ( PM): nuts, tuna packets, saltine crackers Beverages: water (38oz), coffee with cream and sugar, soda   Usual physical activity: walking 1.5 miles   Diet to Follow: Calories: 1800 cals Carbohydrate: 200g  Protein: 135g Fat: 50g  Preferred Learning Style:   No preference indicated   Learning Readiness:   Not ready  Contemplating  Ready  Change in progress  Nutritional Diagnosis:  -3.3 Overweight/obesity related to past poor dietary habits and physical inactivity as evidenced by patient w/ planned sleeve gastrectomy surgery following dietary guidelines for continued weight loss.    Intervention:  Nutrition counseling for upcoming Bariatric Surgery.  Goals:  -Prepare meals so as to eat less fast food, use the meal ideas sheet  Teaching Method Utilized:  Visual Auditory  Handouts given during visit include:  Protein Shakes   Barriers to learning/adherence to lifestyle change:  none  Demonstrated degree of understanding via:  Teach Back   Monitoring/Evaluation:  Dietary intake, exercise, and body weight in 1 month(s).

## 2017-02-01 ENCOUNTER — Encounter: Payer: Self-pay | Admitting: Registered"

## 2017-02-01 ENCOUNTER — Encounter: Payer: 59 | Attending: Surgery | Admitting: Registered"

## 2017-02-01 DIAGNOSIS — Z6841 Body Mass Index (BMI) 40.0 and over, adult: Secondary | ICD-10-CM | POA: Diagnosis not present

## 2017-02-01 DIAGNOSIS — Z713 Dietary counseling and surveillance: Secondary | ICD-10-CM | POA: Insufficient documentation

## 2017-02-01 DIAGNOSIS — E669 Obesity, unspecified: Secondary | ICD-10-CM

## 2017-02-01 NOTE — Patient Instructions (Addendum)
-   Aim to decrease caffeine intake.   - Find 1-2 protein shake additional flavors that you enjoy.   - Listen to your body for fullness when chewing at least 30 times per bite.

## 2017-02-01 NOTE — Progress Notes (Signed)
Appt start time: 10:05 end time: 10:25  Assessment: 5th SWL Appointment.   Start Wt at [facility]: 295.7 Wt: 300.8 BMI: 53.28   Pt arrives with husband having gained about 2 lbs from previous visit. Pt states she is still cooking more, eating about 4 days a week at home.. Pt states she drinks at least 60 oz a day; prefers bottled water. Pt states she likes [employer] systems). Pt states she is still working on creating lifestyle changes and is not in a rush to have surgery. Pt states she is nervous about having liquids only for the first 2 weeks after surgery.    MEDICATIONS: See list   DIETARY INTAKE:  24-hr recall:  B ( AM):  herbalife shake and almond milk Snk ( AM): greek yogurt---none L ( PM): chicken sald on a mini roll Snk ( PM): leftovers from lunch D ( PM): baked chicken wings, steamed broccoli, mac and cheese or baked chicken and tossed salad Snk ( PM): nuts, tuna packets, saltine crackers Beverages: water (38oz), coffee with cream and sugar, soda   Usual physical activity: walking 1.5 miles   Diet to Follow: Calories: 1800 cals Carbohydrate: 200g  Protein: 135g Fat: 50g  Preferred Learning Style:   No preference indicated   Learning Readiness:   Not ready  Contemplating  Ready  Change in progress  Nutritional Diagnosis:  [place]-3.3 Overweight/obesity related to past poor dietary habits and physical inactivity as evidenced by patient w/ planned sleeve gastrectomy surgery following dietary guidelines for continued weight loss.    Intervention:  Nutrition counseling for upcoming Bariatric Surgery.  Goals:  - Aim to decrease caffeine intake.  - Find 1-2 protein shake additional flavors that you enjoy.  - Listen to your body for fullness when chewing at least 30 times per bite.   Teaching Method Utilized:  Visual Auditory  Handouts given during visit include:  none   Barriers to learning/adherence to lifestyle change: none  Demonstrated degree  of understanding via:  Teach Back   Monitoring/Evaluation:  Dietary intake, exercise, and body weight in 1 month(s).

## 2017-03-01 ENCOUNTER — Ambulatory Visit: Payer: 59 | Admitting: Registered"

## 2017-03-04 ENCOUNTER — Encounter: Payer: Self-pay | Admitting: Registered"

## 2017-03-04 ENCOUNTER — Encounter: Payer: 59 | Attending: Surgery | Admitting: Registered"

## 2017-03-04 DIAGNOSIS — Z6841 Body Mass Index (BMI) 40.0 and over, adult: Secondary | ICD-10-CM | POA: Insufficient documentation

## 2017-03-04 DIAGNOSIS — E669 Obesity, unspecified: Secondary | ICD-10-CM

## 2017-03-04 DIAGNOSIS — Z713 Dietary counseling and surveillance: Secondary | ICD-10-CM | POA: Insufficient documentation

## 2017-03-04 NOTE — Progress Notes (Signed)
Appt start time: 3:45 end time: 4:15  Assessment: 6th SWL Appointment.   Start Wt at [facility]: 295.7 Wt: 303.7 BMI: 53.80   Pt arrives with husband and daughter having gained about 2.9 lbs from previous visit. Pt states she has been working 3 jobs in the past and plans to slow down with working; not needing as much caffeine. Pt states she can tolerate vanilla and strawberries and cream protein shakes. Pt states she wants to create structure with schedule and meal planning because she's on the go a lot.    MEDICATIONS: See list   DIETARY INTAKE:  24-hr recall:  B ( AM):  herbalife shake and almond milk Snk ( AM): greek yogurt---none L ( PM): chicken sald on a mini roll Snk ( PM): leftovers from lunch D ( PM): baked chicken wings, steamed broccoli, mac and cheese or baked chicken and tossed salad Snk ( PM): nuts, tuna packets, saltine crackers Beverages: water (38oz), coffee with cream and sugar, soda   Usual physical activity: walking 1.5 miles   Diet to Follow: Calories: 1800 cals Carbohydrate: 200g  Protein: 135g Fat: 50g  Preferred Learning Style:   No preference indicated   Learning Readiness:   Not ready  Contemplating  Ready  Change in progress  Nutritional Diagnosis:  [place]-3.3 Overweight/obesity related to past poor dietary habits and physical inactivity as evidenced by patient w/ planned sleeve gastrectomy surgery following dietary guidelines for continued weight loss.    Intervention:  Nutrition counseling for upcoming Bariatric Surgery.  Goals:  - Continue to aim to not drink 15 minutes before eating, not while eating, and waiting 30 minutes after eating.  - Take multivitamin complete with iron. - Aim to track protein and fluid intake using Baritastic App or MyFitnessPal.   Teaching Method Utilized:  Visual Auditory  Handouts given during visit include:  none   Barriers to learning/adherence to lifestyle change: none  Demonstrated degree of  understanding via:  Teach Back   Monitoring/Evaluation:  Dietary intake, exercise, and body weight prn.

## 2017-03-04 NOTE — Patient Instructions (Addendum)
-   Continue to aim to not drink 15 minutes before eating, not while eating, and waiting 30 minutes after eating.   - Take multivitamin complete with iron.  - Aim to track protein and fluid intake using Baritastic App or MyFitnessPal.

## 2019-02-17 IMAGING — RF DG UGI W/ KUB
13 of 19 series · 14 of 24 positions shown · non-contrast
Comparison: None.

CLINICAL DATA: 30-year-old female pre gastric sleeve procedure. No
complaints. Initial encounter.

EXAM:
UPPER GI SERIES WITH KUB
TECHNIQUE: After obtaining a scout radiograph a routine upper GI series was
performed using thin density barium per protocol.
FLUOROSCOPY TIME:  Fluoroscopy Time:  1 minutes and 42 seconds.
Radiation Exposure Index (if provided by the fluoroscopic device):
132.5 mGy

[Series 1: t abdomen supine · 0.15mm/px · 1 of 1 slices shown]
[im 1/1]
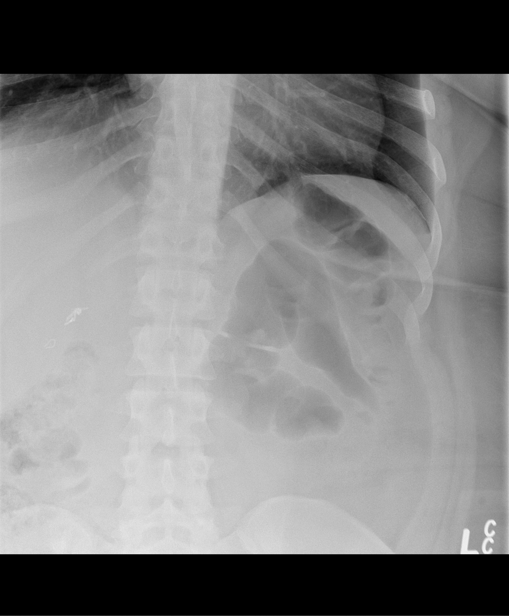

[Series 4: cp_standard · 0.26mm/px · 1 of 1 slices shown (1 of 7)]
[im 1/1]
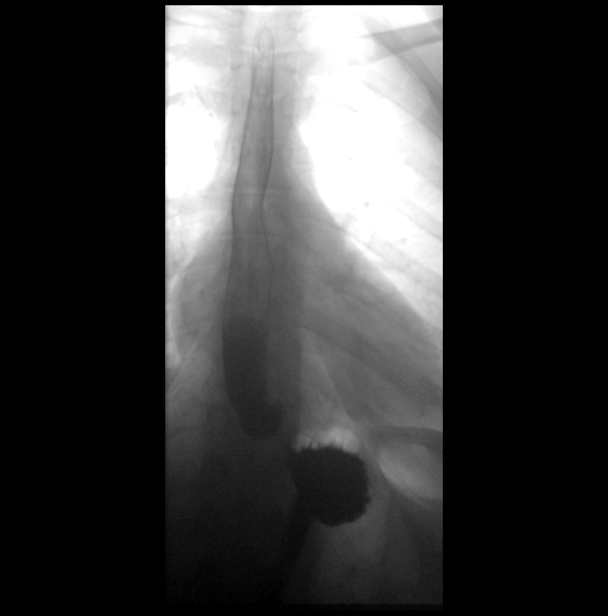

[Series 5: fluoro_barium 2fps_bw · 0.18mm/px · 1 of 13 frames shown (1 of 5)]
[frame 12/13]
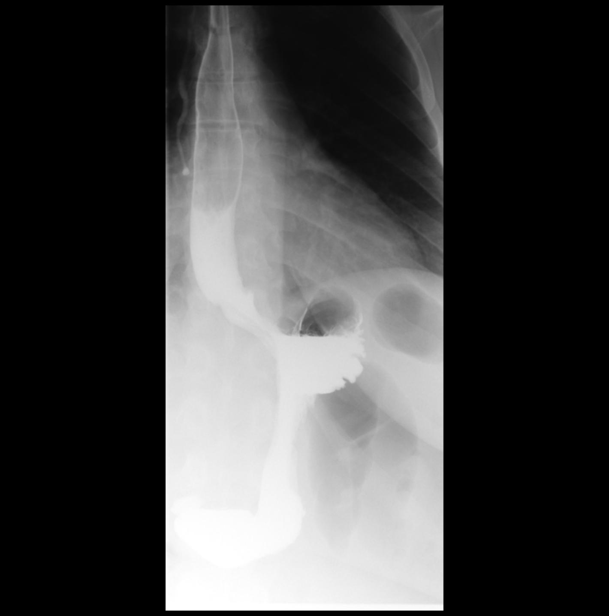

[Series 7: cp_standard · 0.26mm/px · 1 of 1 slices shown (2 of 7)]
[im 1/1]
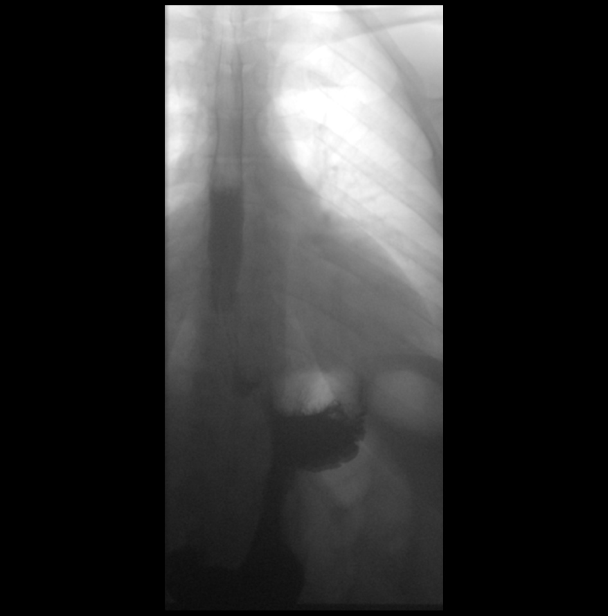

[Series 9: fluoro_barium 2fps_bw · 0.18mm/px · 2 of 13 frames shown (2 of 5)]
[frame 2/13]
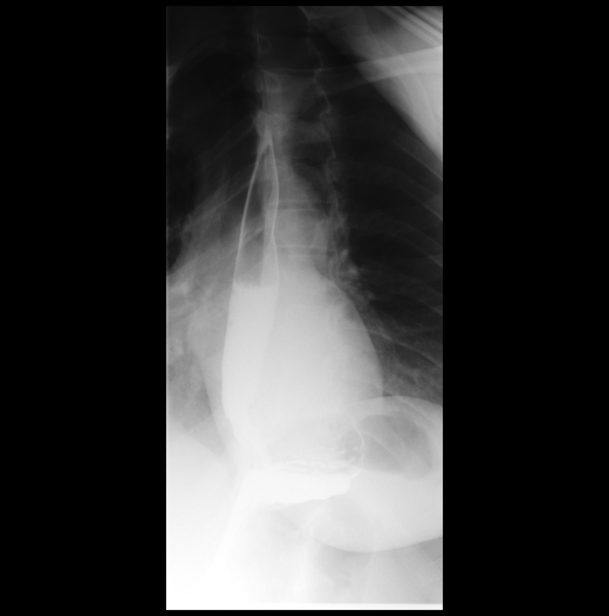
[frame 12/13]
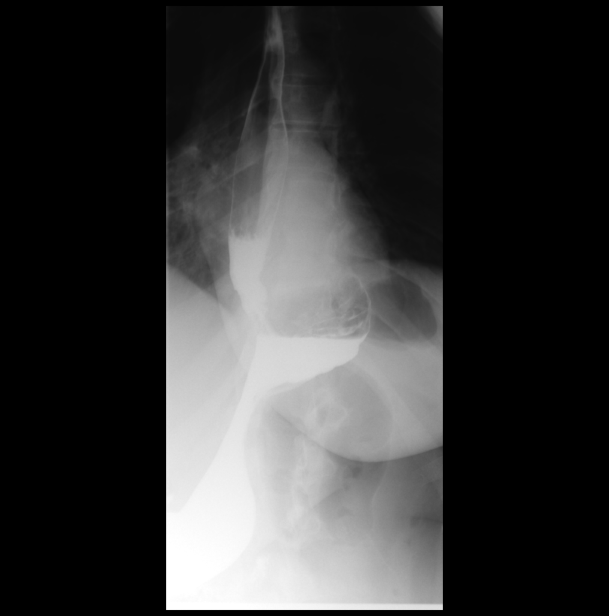

[Series 11: cp_standard · 0.26mm/px · 1 of 1 slices shown (3 of 7)]
[im 1/1]
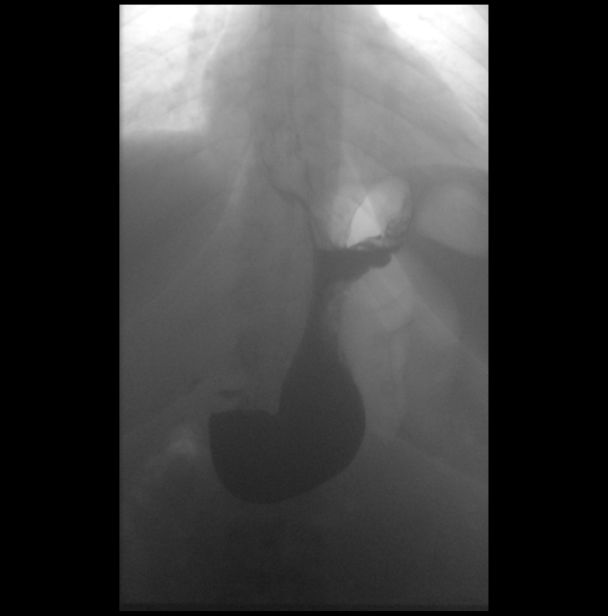

[Series 12: fluoro_barium 2fps_bw · 0.18mm/px · 1 of 8 frames shown (3 of 5)]
[frame 5/8]
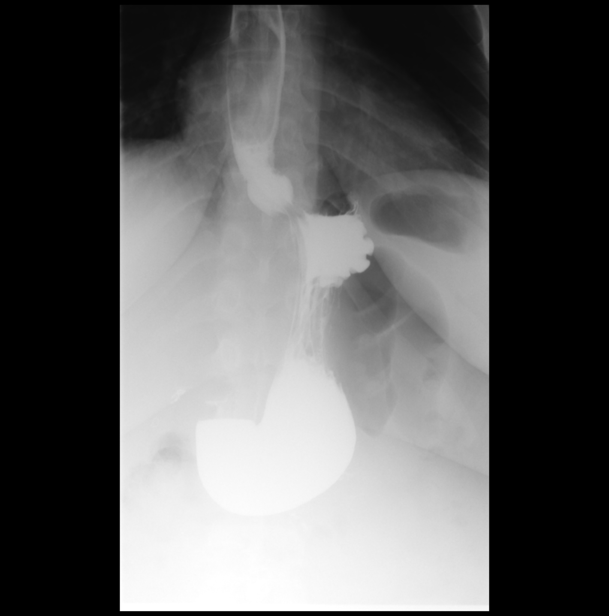

[Series 13: fluoro_barium 2fps_bw · 0.18mm/px · 1 of 2 frames shown (4 of 5)]
[frame 1/2]
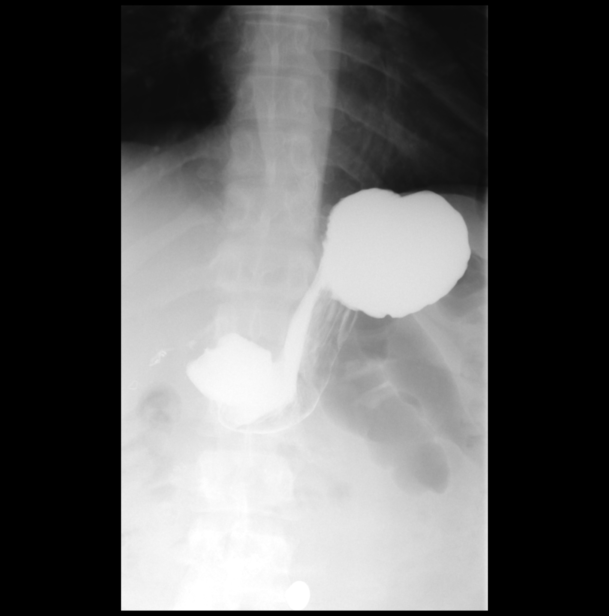

[Series 15: cp_standard · 0.27mm/px · 1 of 1 slices shown (4 of 7)]
[im 1/1]
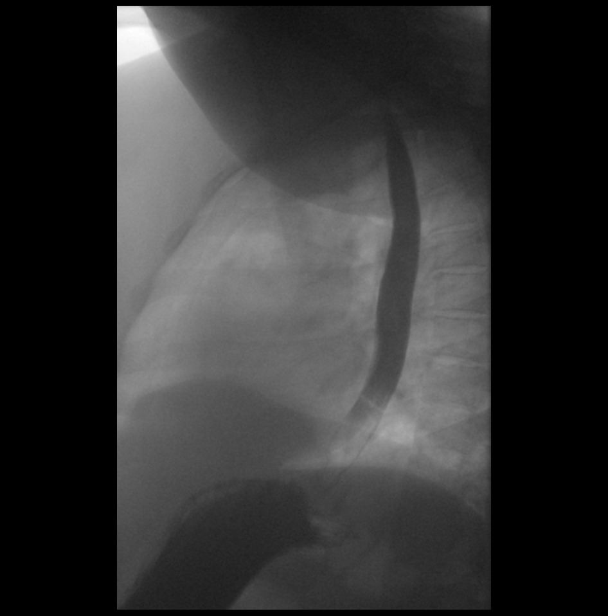

[Series 18: cp_standard · 0.27mm/px · 1 of 1 slices shown (5 of 7)]
[im 1/1]
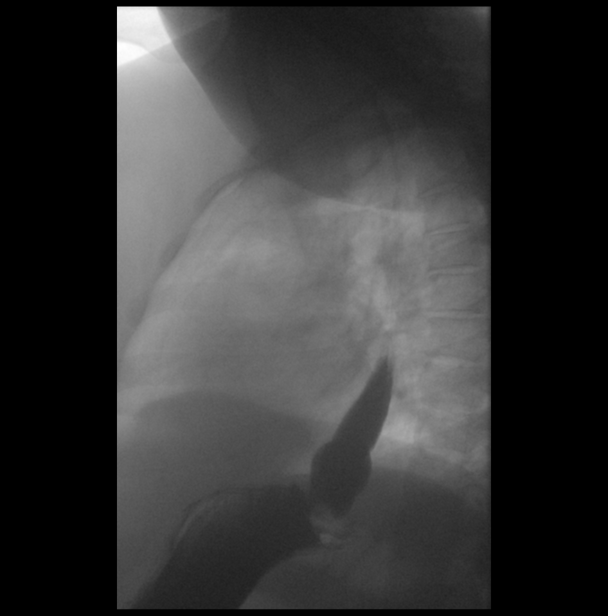

[Series 19: cp_standard · 0.27mm/px · 1 of 1 slices shown (6 of 7)]
[im 1/1]
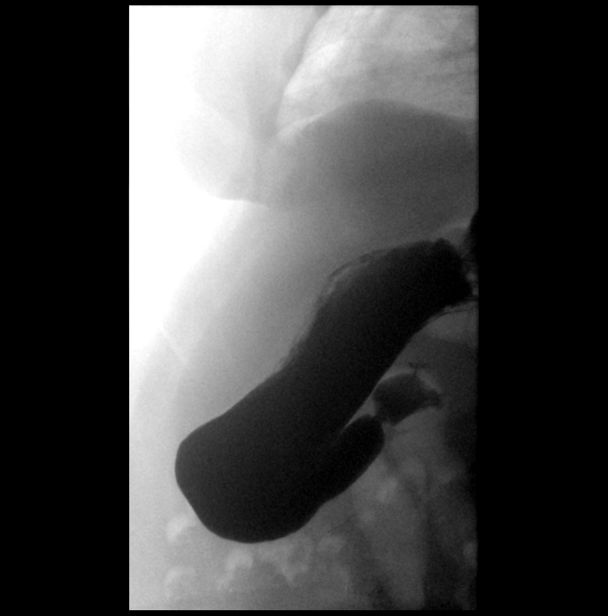

[Series 22: cp_standard · 0.27mm/px · 1 of 1 slices shown (7 of 7)]
[im 1/1]
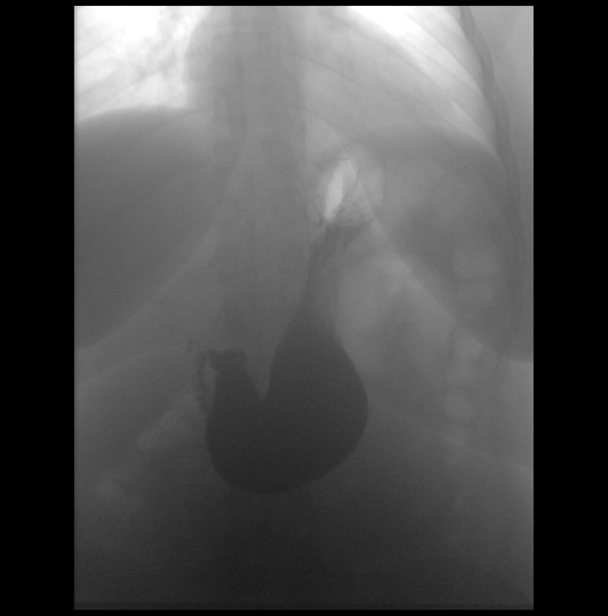

[Series 24: fluoro_barium 2fps_bw · 0.17mm/px · 1 of 2 frames shown (5 of 5)]
[frame 2/2]
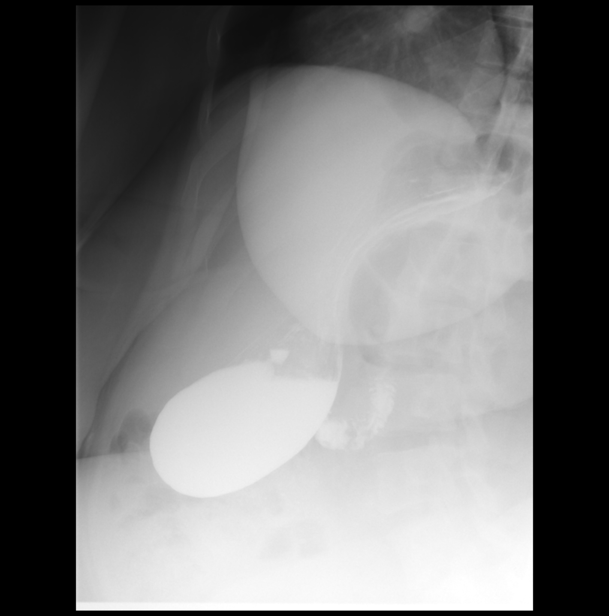

[14 of 24 positions shown; findings below may reference images not displayed]

FINDINGS: Scout view reveals prior cholecystectomy.

Normal primary esophageal stripping wave without esophageal
obstructing lesion. Very small sliding-type hiatal hernia with mild
smooth narrowing just above the hiatal hernia. No reflux with change
of patient position. 13 mm barium tablet ingested without any
difficulty.

Impression upon left lateral/posterior gastric body/fundus by bowel.
No primary gastric mass or ulceration identified on this single
contrast exam.

Duodenal bulb of normal configuration without ulceration.

Slightly delayed emptying of the stomach. Proximally visualized
small bowel unremarkable.
IMPRESSION: Very small sliding-type hiatal hernia. No reflux demonstrated with
change of patient position.

Impression upon left lateral/posterior gastric body/fundus by bowel.
No primary gastric mass or ulceration identified on this single
contrast exam.

## 2020-08-15 ENCOUNTER — Encounter (HOSPITAL_COMMUNITY): Payer: Self-pay | Admitting: Obstetrics & Gynecology

## 2020-08-15 ENCOUNTER — Other Ambulatory Visit: Payer: Self-pay | Admitting: Obstetrics & Gynecology

## 2020-08-15 ENCOUNTER — Other Ambulatory Visit: Payer: Self-pay

## 2020-08-15 DIAGNOSIS — O021 Missed abortion: Secondary | ICD-10-CM

## 2020-08-15 MED ORDER — DOXYCYCLINE HYCLATE 100 MG PO TABS: 100.0000 mg | ORAL_TABLET | Freq: Two times a day (BID) | ORAL | Status: DC

## 2020-08-15 NOTE — Anesthesia Preprocedure Evaluation (Addendum)
Anesthesia Evaluation  Patient identified by MRN, date of birth, ID band Patient awake    Reviewed: Allergy & Precautions, NPO status , Patient's Chart, lab work & pertinent test results  History of Anesthesia Complications Negative for: history of anesthetic complications  Airway Mallampati: I  TM Distance: >3 FB Neck ROM: Full    Dental  (+) Teeth Intact, Dental Advisory Given   Pulmonary neg pulmonary ROS,    breath sounds clear to auscultation       Cardiovascular hypertension, Pt. on medications (-) angina Rhythm:Regular Rate:Normal     Neuro/Psych Anxiety negative neurological ROS     GI/Hepatic negative GI ROS, Neg liver ROS,   Endo/Other  Morbid obesity  Renal/GU negative Renal ROS     Musculoskeletal   Abdominal (+) + obese,   Peds  Hematology negative hematology ROS (+)   Anesthesia Other Findings   Reproductive/Obstetrics (+) Pregnancy (miscarriage)                           Anesthesia Physical Anesthesia Plan  ASA: III  Anesthesia Plan: General   Post-op Pain Management:    Induction: Intravenous  PONV Risk Score and Plan: 3 and Ondansetron, Dexamethasone, Scopolamine patch - Pre-op and Treatment may vary due to age or medical condition  Airway Management Planned: LMA  Additional Equipment: None  Intra-op Plan:   Post-operative Plan:   Informed Consent: I have reviewed the patients History and Physical, chart, labs and discussed the procedure including the risks, benefits and alternatives for the proposed anesthesia with the patient or authorized representative who has indicated his/her understanding and acceptance.     Dental advisory given  Plan Discussed with: CRNA and Surgeon  Anesthesia Plan Comments: (PAT note written 08/15/2020 by Shonna Chock, PA-C. )      Anesthesia Quick Evaluation

## 2020-08-15 NOTE — Progress Notes (Signed)
PCP Yetta Barre, NP  Cardiologist - n/a  Chest x-ray -  EKG - DOS Stress Test -  ECHO -  Cardiac Cath -    Blood Thinner Instructions:  Aspirin Instructions: per pt last dose ASA 5/24  ERAS Protcol - clears until 1200 DOS  COVID TEST- n/a  Anesthesia review: yes   -------------  SDW INSTRUCTIONS:  Your procedure is scheduled on 5/27 Friday . Please report to [facility] Main Entrance "A" at 12:30 P.M., and check in at the Admitting office. Call this number if you have problems the morning of surgery: [phone]   Remember: Do not eat after midnight the night before your surgery  You may drink clear liquids until 12pm noon day of your surgery.   Clear liquids allowed are: Water, Non-Citrus Juices (without pulp), Carbonated Beverages, Clear Tea, Black Coffee Only, and Gatorade   Medications to take morning of surgery with a sip of water include: Labetalol  Tylenol if needed zofran if needed   As of today, STOP taking any Aspirin (unless otherwise instructed by your surgeon), Aleve, Naproxen, Ibuprofen, Motrin, Advil, Goody's, BC's, all herbal medications, fish oil, and all vitamins.    The Morning of Surgery Do not wear jewelry, make-up or nail polish. Do not wear lotions, powders, or perfumes, or deodorant Do not shave 48 hours prior to surgery.   Do not bring valuables to the hospital. [facility] is not responsible for any belongings or valuables. If you are a smoker, DO NOT Smoke 24 hours prior to surgery If you wear a CPAP at night please bring your mask the morning of surgery  Remember that you must have someone to transport you home after your surgery, and remain with you for 24 hours if you are discharged the same day. Please bring cases for contacts, glasses, hearing aids, dentures or bridgework because it cannot be worn into surgery.   Patients discharged the day of surgery will not be allowed to drive home.   Please shower the NIGHT BEFORE SURGERY and the  MORNING OF SURGERY with DIAL Soap. Wear comfortable clothes the morning of surgery. Oral Hygiene is also important to reduce your risk of infection.  Remember - BRUSH YOUR TEETH THE MORNING OF SURGERY WITH YOUR REGULAR TOOTHPASTE  Patient denies shortness of breath, fever, cough and chest pain.

## 2020-08-15 NOTE — H&P (Signed)
Dawn Dawson is an 35 y.o. female G2P0101 supposed to be [redacted] weeks pregnant who was found to have a missed abortion at 12 weeks 3 days EGA of monozygotic monochorionic twins who is here for a suction dilation and curettage.  LMP 05/07/2020.  [facility] 02/11/2021.    Pertinent Gynecological History: Menses: LMP 05/07/2020 Bleeding: None Contraception: none DES exposure: unknown Blood transfusions: none Sexually transmitted diseases: past history: Chlamydia Previous GYN Procedures: None  Last mammogram: N/A Last pap: normal Date: 09/08/2015.  OB History: G2, P0101      Past Medical History:  Diagnosis Date  . Anxiety   . Heart murmur    as child  . HTN (hypertension)    Stopped Aldomet w/ positive UPT then restarted  Oct. 2013  . Hypertension 2012   WAS TAKING ALDOMET  . Obese 10/04/2011   pregravid BMI=48  . Urinary tract infection     Past Surgical History:  Procedure Laterality Date  . CESAREAN SECTION  04/10/2012   Procedure: CESAREAN SECTION;  Surgeon: Purcell Nails, MD;  Location: [facility];  Service: Obstetrics;  Laterality: N/A;  Primary cesarean section of baby girl at 2223 APGAR 8/8  . CHOLECYSTECTOMY OPEN  2007   NO COMPLICATIONS  . WISDOM TOOTH EXTRACTION     ALL 4 EXTRACTED    Family History  Problem Relation Age of Onset  . Kidney disease Maternal Grandmother        DIALYSIS  . Hypertension Father   . Seizures Maternal Uncle   . Emphysema Maternal Uncle   . Emphysema Cousin        MATERNAL  . Other Neg Hx     Social History:  reports that she has never smoked. She has never used smokeless tobacco. She reports that she does not drink alcohol and does not use drugs.  Allergies: No Known Allergies  No medications prior to admission.   Current Outpatient Medications  Medication Instructions  . acetaminophen (TYLENOL) 500 mg, Oral, Every 6 hours PRN  . aspirin 325 mg, Oral, Daily  . docusate sodium (COLACE) 200 mg, Oral, Daily PRN  . labetalol  (NORMODYNE) 100 mg, Oral, 2 times daily  . ondansetron (ZOFRAN-ODT) 8 mg, Oral, Daily PRN   Review of Systems ROS  Constitutional: Denies fevers/chills Cardiovascular: Denies chest pain or palpitations Pulmonary: Denies coughing or wheezing Gastrointestinal: Denies nausea, vomiting or diarrhea Genitourinary: Denies pelvic pain, unusual vaginal bleeding, unusual vaginal discharge, dysuria, urgency or frequency.  Musculoskeletal: Denies muscle or joint aches and pain.  Neurology: Denies abnormal sensations such as tingling or numbness.  unknown if currently breastfeeding. Physical Exam Constitutional: She is oriented to person, place, and time. She appears well-nourished.  She is morbidly obese, Ht 50ft 3 inches, 142 kg, BMI 55.80.   Blood pressure (!) 162/70, pulse 76, temperature 98.3 F (36.8 C), temperature source Oral, resp. rate 18, height 5\' 3"  (1.6 m), weight (!) 142.9 kg, last menstrual period 05/07/2020, SpO2 100 %, unknown if currently breastfeeding. HENT:  Head: Normocephalic and atraumatic.  Neck: Normal range of motion.  Cardiovascular: Normal rate, regular rhythm and normal heart sounds.   Respiratory: Effort normal and breath sounds normal.  GI: Soft. Bowel sounds are normal.  Neurological: She is alert and oriented to person, place, and time.  Skin: Skin is warm and dry.  Psychiatric: She has a normal mood and affect. Her behavior is normal.   CBC  Recent Results (from the past 2160 hour(s))  Basic metabolic panel  Status: Abnormal   Collection Time: 08/16/20 12:22 PM  Result Value Ref Range   Sodium 134 (L) 135 - 145 mmol/L   Potassium 3.5 3.5 - 5.1 mmol/L   Chloride 102 98 - 111 mmol/L   CO2 23 22 - 32 mmol/L   Glucose, Bld 89 70 - 99 mg/dL    Comment: Glucose reference range applies only to samples taken after fasting for at least 8 hours.   BUN 9 6 - 20 mg/dL   Creatinine, Ser 2.59 0.44 - 1.00 mg/dL   Calcium 9.2 8.9 - 56.3 mg/dL   GFR, Estimated >87 >56  mL/min    Comment: (NOTE) Calculated using the CKD-EPI Creatinine Equation (2021)    Anion gap 9 5 - 15    Comment: Performed at [facility], [address] [place], [place] [address]  CBC     Status: None   Collection Time: 08/16/20 12:22 PM  Result Value Ref Range   WBC 9.3 4.0 - 10.5 K/uL   RBC 4.52 3.87 - 5.11 MIL/uL   Hemoglobin 12.2 12.0 - 15.0 g/dL   HCT 51.8 84.1 - 66.0 %   MCV 83.8 80.0 - 100.0 fL   MCH 27.0 26.0 - 34.0 pg   MCHC 32.2 30.0 - 36.0 g/dL   RDW 63.0 16.0 - 10.9 %   Platelets 337 150 - 400 K/uL   nRBC 0.0 0.0 - 0.2 %    Comment: Performed at [facility], [address], [place], [place] [address]  Type and screen     Status: None   Collection Time: 08/16/20 12:55 PM  Result Value Ref Range   ABO/RH(D) A POS    Antibody Screen NEG    Sample Expiration      08/19/2020,2359 Performed at [facility], [address] [place], [place] [address]    Pelvic ultrasound: 08/14/2020: Monochorionic monoamniotic twin pregnancy in transverse transverse lie, no fetal heart beat in either twin.Twin A measures 12 weeks 2 days.  Twin B measures 12 weeks 3 days.   Blood group A negative as per office test.   Assessment/Plan: 35 y/o P1 with a missed abortion of twin pregnancy at 12 weeks 3 days EGA. - Admit to Outpatient surgery, [facility]. - NPO and IV fluids. - This procedure has been fully reviewed with the patient and written informed consent has been obtained.  We discussed risks of bleeding, infection and damage to organs.  We discussed risks of uterine scarring which may negatively affect fertility in the future.    - Plan for Rhogam injection in post op time.   Prescilla Sours 08/15/2020, 3:11 PM

## 2020-08-15 NOTE — Progress Notes (Signed)
Anesthesia Chart Review: Dawn Dawson   Case: [id number] Date/Time: 08/16/20 1445   Procedures:      DILATATION AND EVACUATION (N/A ) - PROVIDER NEEDS ULTRASOUND GUIDANCE FOR PROCEDURE     OPERATIVE ULTRASOUND (N/A )   Anesthesia type: Choice   Pre-op diagnosis: MISCARRIAGE   [facility] 08 / [facility]   Surgeons: Hoover Browns, MD      DISCUSSION: Patient is a 35 year old female scheduled for the above procedure.  History includes never smoker, HTN, childhood murmur (resolved per patient), anxiety, cholecystectomy (2007), c-section (04/10/12), obesity.   08/14/20 14 week US showed no fetal cardiac activity in twin A and twin B (missed abortion at 12w 3d). She is now scheduled for the above procedure. She is for labs and anesthesia team evaluation on the day of surgery. Last ASA 08/13/20.    VS: Ht 5\' 3"  (1.6 m)   Wt (!) 142.9 kg   BMI 55.80 kg/m  BP Readings from Last 3 Encounters:  04/13/12 138/83  04/05/12 (!) 144/84  04/01/12 132/80   Pulse Readings from Last 3 Encounters:  04/13/12 (!) 111  03/29/12 (!) 115  03/26/12 97   PROVIDERS: 05/24/12, NP is PCP    EKG: 08/04/16: NSR   CV: N/A  Past Medical History:  Diagnosis Date  . Anxiety   . Heart murmur    as child  . HTN (hypertension)    Stopped Aldomet w/ positive UPT then restarted  Oct. 2013  . Hypertension 2012   WAS TAKING ALDOMET  . Obese 10/04/2011   pregravid BMI=48  . Urinary tract infection     Past Surgical History:  Procedure Laterality Date  . CESAREAN SECTION  04/10/2012   Procedure: CESAREAN SECTION;  Surgeon: 04/12/2012, MD;  Location: [facility];  Service: Obstetrics;  Laterality: N/A;  Primary cesarean section of baby girl at 2223 APGAR 8/8  . CHOLECYSTECTOMY OPEN  2007   NO COMPLICATIONS  . WISDOM TOOTH EXTRACTION     ALL 4 EXTRACTED    MEDICATIONS: No current facility-administered medications for this encounter.   2008 acetaminophen (TYLENOL) 500 MG tablet  . aspirin 325 MG tablet   . docusate sodium (COLACE) 100 MG capsule  . labetalol (NORMODYNE) 100 MG tablet  . ondansetron (ZOFRAN-ODT) 8 MG disintegrating tablet    Marland Kitchen, PA-C Surgical Short Stay/Anesthesiology [facility] Phone [phone] [facility] Phone [phone] 08/15/2020 3:49 PM

## 2020-08-16 ENCOUNTER — Ambulatory Visit (HOSPITAL_COMMUNITY): Payer: BC Managed Care – PPO | Admitting: Vascular Surgery

## 2020-08-16 ENCOUNTER — Encounter (HOSPITAL_COMMUNITY)
Admission: RE | Disposition: A | Discharge status: Home / Self Care | Payer: Self-pay | Source: Home / Self Care | Attending: Obstetrics & Gynecology

## 2020-08-16 ENCOUNTER — Ambulatory Visit (HOSPITAL_COMMUNITY): Payer: BC Managed Care – PPO

## 2020-08-16 ENCOUNTER — Encounter (HOSPITAL_COMMUNITY): Payer: Self-pay | Admitting: Obstetrics & Gynecology

## 2020-08-16 ENCOUNTER — Ambulatory Visit (HOSPITAL_COMMUNITY)
Admission: RE | Admit: 2020-08-16 | Discharge: 2020-08-16 | Disposition: A | Discharge status: Home / Self Care | Payer: BC Managed Care – PPO | Attending: Obstetrics & Gynecology | Admitting: Obstetrics & Gynecology

## 2020-08-16 DIAGNOSIS — Z7982 Long term (current) use of aspirin: Secondary | ICD-10-CM | POA: Diagnosis not present

## 2020-08-16 DIAGNOSIS — O021 Missed abortion: Secondary | ICD-10-CM | POA: Diagnosis present

## 2020-08-16 DIAGNOSIS — O161 Unspecified maternal hypertension, first trimester: Secondary | ICD-10-CM | POA: Insufficient documentation

## 2020-08-16 DIAGNOSIS — Z79899 Other long term (current) drug therapy: Secondary | ICD-10-CM | POA: Insufficient documentation

## 2020-08-16 DIAGNOSIS — Z3A14 14 weeks gestation of pregnancy: Secondary | ICD-10-CM | POA: Diagnosis not present

## 2020-08-16 HISTORY — PX: DILATION AND EVACUATION: SHX1459

## 2020-08-16 HISTORY — PX: OPERATIVE ULTRASOUND: SHX5996

## 2020-08-16 LAB — BASIC METABOLIC PANEL
Anion gap: 9 (ref 5–15)
BUN: 9 mg/dL (ref 6–20)
CO2: 23 mmol/L (ref 22–32)
Calcium: 9.2 mg/dL (ref 8.9–10.3)
Chloride: 102 mmol/L (ref 98–111)
Creatinine, Ser: 0.94 mg/dL (ref 0.44–1.00)
GFR, Estimated: >60 mL/min (ref >60)
Glucose, Bld: 89 mg/dL (ref 70–99)
Potassium: 3.5 mmol/L (ref 3.5–5.1)
Sodium: 134 mmol/L — ABNORMAL LOW (ref 135–145)

## 2020-08-16 LAB — CBC
HCT: 37.9 % (ref 36.0–46.0)
Hemoglobin: 12.2 g/dL (ref 12.0–15.0)
MCH: 27 pg (ref 26.0–34.0)
MCHC: 32.2 g/dL (ref 30.0–36.0)
MCV: 83.8 fL (ref 80.0–100.0)
Platelets: 337 10*3/uL (ref 150–400)
RBC: 4.52 MIL/uL (ref 3.87–5.11)
RDW: 14.1 % (ref 11.5–15.5)
WBC: 9.3 10*3/uL (ref 4.0–10.5)
nRBC: 0 % (ref 0.0–0.2)

## 2020-08-16 LAB — TYPE AND SCREEN
ABO/RH(D): A POS
Antibody Screen: NEGATIVE

## 2020-08-16 SURGERY — DILATION AND EVACUATION, UTERUS
Anesthesia: General

## 2020-08-16 MED ORDER — METHYLERGONOVINE MALEATE 0.2 MG/ML IJ SOLN
INTRAMUSCULAR | Status: DC | PRN
  Administered 2020-08-16: .2 mg via INTRAMUSCULAR

## 2020-08-16 MED ORDER — DOXYCYCLINE HYCLATE 100 MG PO TABS
200.0000 mg | ORAL_TABLET | Freq: Once | ORAL | Status: AC
  Filled 2020-08-16: qty 2

## 2020-08-16 MED ORDER — FENTANYL CITRATE (PF) 100 MCG/2ML IJ SOLN
25.0000 ug | INTRAMUSCULAR | Status: DC | PRN
  Administered 2020-08-16: 50 ug via INTRAVENOUS

## 2020-08-16 MED ORDER — MIDAZOLAM HCL 2 MG/2ML IJ SOLN
INTRAMUSCULAR | Status: DC | PRN
  Administered 2020-08-16: 2 mg via INTRAVENOUS

## 2020-08-16 MED ORDER — MIDAZOLAM HCL 2 MG/2ML IJ SOLN: 0.5000 mg | Freq: Once | INTRAMUSCULAR | Status: DC | PRN

## 2020-08-16 MED ORDER — MIDAZOLAM HCL 2 MG/2ML IJ SOLN
INTRAMUSCULAR | Status: AC
  Filled 2020-08-16: qty 2

## 2020-08-16 MED ORDER — CHLORHEXIDINE GLUCONATE 0.12 % MT SOLN
OROMUCOSAL | Status: AC
  Administered 2020-08-16: 15 mL
  Filled 2020-08-16: qty 15

## 2020-08-16 MED ORDER — DOXYCYCLINE HYCLATE 100 MG PO TABS
ORAL_TABLET | ORAL | Status: AC
  Administered 2020-08-16: 200 mg via ORAL
  Filled 2020-08-16: qty 2

## 2020-08-16 MED ORDER — PROPOFOL 10 MG/ML IV BOLUS
INTRAVENOUS | Status: AC
  Filled 2020-08-16: qty 20

## 2020-08-16 MED ORDER — OXYCODONE HCL 5 MG PO TABS: 5.0000 mg | ORAL_TABLET | ORAL | Status: DC | PRN

## 2020-08-16 MED ORDER — FENTANYL CITRATE (PF) 100 MCG/2ML IJ SOLN
INTRAMUSCULAR | Status: DC | PRN
  Administered 2020-08-16: 50 ug via INTRAVENOUS

## 2020-08-16 MED ORDER — FENTANYL CITRATE (PF) 250 MCG/5ML IJ SOLN
INTRAMUSCULAR | Status: AC
  Filled 2020-08-16: qty 5

## 2020-08-16 MED ORDER — IBUPROFEN 800 MG PO TABS: 800.0000 mg | ORAL_TABLET | Freq: Three times a day (TID) | ORAL | 0 refills | Status: DC | PRN

## 2020-08-16 MED ORDER — IBUPROFEN 800 MG PO TABS
800.0000 mg | ORAL_TABLET | Freq: Three times a day (TID) | ORAL | Status: DC | PRN
  Filled 2020-08-16: qty 1

## 2020-08-16 MED ORDER — FERROUS SULFATE 325 (65 FE) MG PO TABS: 325.0000 mg | ORAL_TABLET | ORAL | 1 refills | Status: DC

## 2020-08-16 MED ORDER — OXYCODONE HCL 5 MG PO TABS: 5.0000 mg | ORAL_TABLET | Freq: Once | ORAL | Status: DC | PRN

## 2020-08-16 MED ORDER — LACTATED RINGERS IV SOLN: INTRAVENOUS | Status: DC

## 2020-08-16 MED ORDER — SCOPOLAMINE 1 MG/3DAYS TD PT72
1.0000 | MEDICATED_PATCH | TRANSDERMAL | Status: DC
  Administered 2020-08-16: 1.5 mg via TRANSDERMAL
  Filled 2020-08-16: qty 1

## 2020-08-16 MED ORDER — FENTANYL CITRATE (PF) 100 MCG/2ML IJ SOLN
INTRAMUSCULAR | Status: AC
  Filled 2020-08-16: qty 2

## 2020-08-16 MED ORDER — ONDANSETRON HCL 4 MG/2ML IJ SOLN
INTRAMUSCULAR | Status: DC | PRN
  Administered 2020-08-16: 4 mg via INTRAVENOUS

## 2020-08-16 MED ORDER — MEPERIDINE HCL 25 MG/ML IJ SOLN: 6.2500 mg | INTRAMUSCULAR | Status: DC | PRN

## 2020-08-16 MED ORDER — PROMETHAZINE HCL 25 MG/ML IJ SOLN: 6.2500 mg | INTRAMUSCULAR | Status: DC | PRN

## 2020-08-16 MED ORDER — POVIDONE-IODINE 10 % EX SWAB
2.0000 "application " | Freq: Once | CUTANEOUS | Status: AC
  Administered 2020-08-16: 2 via TOPICAL

## 2020-08-16 MED ORDER — LACTATED RINGERS IV SOLN: INTRAVENOUS | Status: DC | PRN

## 2020-08-16 MED ORDER — PROPOFOL 10 MG/ML IV BOLUS
INTRAVENOUS | Status: DC | PRN
  Administered 2020-08-16: 260 mg via INTRAVENOUS

## 2020-08-16 MED ORDER — DEXAMETHASONE SODIUM PHOSPHATE 10 MG/ML IJ SOLN
INTRAMUSCULAR | Status: AC
  Filled 2020-08-16: qty 2

## 2020-08-16 MED ORDER — OXYCODONE HCL 5 MG/5ML PO SOLN: 5.0000 mg | Freq: Once | ORAL | Status: DC | PRN

## 2020-08-16 MED ORDER — DEXAMETHASONE SODIUM PHOSPHATE 4 MG/ML IJ SOLN
INTRAMUSCULAR | Status: DC | PRN
  Administered 2020-08-16: 10 mg via INTRAVENOUS

## 2020-08-16 MED ORDER — SILVER NITRATE-POT NITRATE 75-25 % EX MISC
CUTANEOUS | Status: AC
  Filled 2020-08-16: qty 10

## 2020-08-16 MED ORDER — FERROUS SULFATE 325 (65 FE) MG PO TABS: 325.0000 mg | ORAL_TABLET | ORAL | Status: DC

## 2020-08-16 MED ORDER — METHYLERGONOVINE MALEATE 0.2 MG/ML IJ SOLN
INTRAMUSCULAR | Status: AC
  Filled 2020-08-16: qty 1

## 2020-08-16 MED ORDER — ONDANSETRON HCL 4 MG/2ML IJ SOLN
INTRAMUSCULAR | Status: AC
  Filled 2020-08-16: qty 4

## 2020-08-16 MED ORDER — BUPIVACAINE-EPINEPHRINE 0.5% -1:200000 IJ SOLN
INTRAMUSCULAR | Status: AC
  Filled 2020-08-16: qty 1

## 2020-08-16 MED ORDER — BUPIVACAINE-EPINEPHRINE (PF) 0.25% -1:200000 IJ SOLN
INTRAMUSCULAR | Status: AC
  Filled 2020-08-16: qty 30

## 2020-08-16 MED ORDER — OXYCODONE HCL 5 MG PO TABS: 5.0000 mg | ORAL_TABLET | ORAL | 0 refills | Status: DC | PRN

## 2020-08-16 MED ORDER — BUPIVACAINE-EPINEPHRINE 0.5% -1:200000 IJ SOLN
INTRAMUSCULAR | Status: DC | PRN
  Administered 2020-08-16: 10 mL

## 2020-08-16 SURGICAL SUPPLY — 23 items
CATH ROBINSON RED A/P 16FR (CATHETERS) ×2 IMPLANT
DECANTER SPIKE VIAL GLASS SM (MISCELLANEOUS) ×2 IMPLANT
FILTER UTR ASPR ASSEMBLY (MISCELLANEOUS) ×2 IMPLANT
GLOVE BIO SURGEON STRL SZ 6.5 (GLOVE) ×2 IMPLANT
GLOVE SURG UNDER POLY LF SZ7 (GLOVE) ×4 IMPLANT
GOWN STRL REUS W/ TWL LRG LVL3 (GOWN DISPOSABLE) ×2 IMPLANT
GOWN STRL REUS W/TWL LRG LVL3 (GOWN DISPOSABLE) ×4
HOSE CONNECTING 18IN BERKELEY (TUBING) ×2 IMPLANT
KIT BERKELEY 1ST TRI 3/8 NO TR (MISCELLANEOUS) ×2 IMPLANT
KIT BERKELEY 1ST TRIMESTER 3/8 (MISCELLANEOUS) ×2 IMPLANT
NS IRRIG 1000ML POUR BTL (IV SOLUTION) ×2 IMPLANT
PACK VAGINAL MINOR WOMEN LF (CUSTOM PROCEDURE TRAY) ×2 IMPLANT
PAD OB MATERNITY 4.3X12.25 (PERSONAL CARE ITEMS) ×2 IMPLANT
SET BERKELEY SUCTION TUBING (SUCTIONS) ×2 IMPLANT
TOWEL GREEN STERILE FF (TOWEL DISPOSABLE) ×4 IMPLANT
TUBE VACURETTE 2ND TRIMESTER (CANNULA) ×1 IMPLANT
UNDERPAD 30X36 HEAVY ABSORB (UNDERPADS AND DIAPERS) ×2 IMPLANT
VACURETTE 10 RIGID CVD (CANNULA) IMPLANT
VACURETTE 12 RIGID CVD (CANNULA) ×1 IMPLANT
VACURETTE 14MM CVD 1/2 BASE (CANNULA) ×1 IMPLANT
VACURETTE 7MM CVD STRL WRAP (CANNULA) IMPLANT
VACURETTE 8 RIGID CVD (CANNULA) IMPLANT
VACURETTE 9 RIGID CVD (CANNULA) IMPLANT

## 2020-08-16 NOTE — Transfer of Care (Signed)
Immediate Anesthesia Transfer of Care Note  Patient: Dawn Dawson  Procedure(s) Performed: DILATATION AND EVACUATION (N/A ) OPERATIVE ULTRASOUND (N/A )  Patient Location: PACU  Anesthesia Type:General  Level of Consciousness: drowsy and patient cooperative  Airway & Oxygen Therapy: Patient Spontanous Breathing and Patient connected to face mask oxygen  Post-op Assessment: Report given to RN and Post -op Vital signs reviewed and stable  Post vital signs: Reviewed and stable  Last Vitals:  Vitals Value Taken Time  BP 134/81 08/16/20 1653  Temp 36.8 C 08/16/20 1653  Pulse 82 08/16/20 1655  Resp 25 08/16/20 1655  SpO2 100 % 08/16/20 1655  Vitals shown include unvalidated device data.  Last Pain:  Vitals:   08/16/20 1653  TempSrc:   PainSc: Asleep         Complications: No complications documented.

## 2020-08-16 NOTE — Anesthesia Procedure Notes (Signed)
Performed by: Tonea Leiphart A, CRNA       

## 2020-08-16 NOTE — Discharge Instructions (Signed)
[place], 1. Nothing in vagina x 2 weeks: I.e no sex, no tampons, no douching x 2 weeks.   2. Expect some vaginal bleeding for next several days, call me if with excessive bleeding requiring you to change a pad every 2 hours or less.  3.  Expect some abdominal cramping, take pain medication as needed.  Call me if pain is intolerable despite medication use. 4. Please call office to make an appointment to see me in 2 weeks for post procedure check. Dr. Sallye Ober.  Office: [phone] extension [phone].   Miscarriage A miscarriage is the loss of pregnancy before the 20th week. Most miscarriages happen during the first 3 months of pregnancy. Sometimes, a miscarriage can happen before a woman knows that she is pregnant. Having a miscarriage can be an emotional experience. If you have had a miscarriage, talk with your health care provider about any questions you may have about the loss of your baby, the grieving process, and your plans for future pregnancy. What are the causes? Many times, the cause of a miscarriage is not known. What increases the risk? The following factors may make a pregnant woman more likely to have a miscarriage: Certain medical conditions  Conditions that affect the hormone balance in the body, such as thyroid disease or polycystic ovary syndrome.  Diabetes.  Autoimmune disorders.  Infections.  Bleeding disorders.  Obesity. Lifestyle factors  Using products with tobacco or nicotine in them or being exposed to tobacco smoke.  Having alcohol.  Having large amounts of caffeine.  Recreational drug use. Problems with reproductive organs or structures  Cervical insufficiency. This is when the lowest part of the uterus (cervix) opens and thins before pregnancy is at term.  Having a condition called Asherman syndrome. This syndrome causes scarring in the uterus or causes the uterus to be abnormal in structure.  Fibrous growths, called fibroids, in the  uterus.  Congenital abnormalities. These problems are present at birth.  Infection of the cervix or uterus. Personal or medical history  Injury (trauma).  Having had a miscarriage before.  Being younger than age 64 or older than age 72.  Exposure to harmful substances in the environment. This may include radiation or heavy metals, such as lead.  Use of certain medicines. What are the signs or symptoms? Symptoms of this condition include:  Vaginal bleeding or spotting, with or without cramps or pain.  Pain or cramping in the abdomen or lower back.  Fluid or tissue coming out of the vagina. How is this diagnosed? This condition may be diagnosed based on:  A physical exam.  Ultrasound.  Lab tests, such as blood tests, urine tests, or swabs for infection. How is this treated? Treatment for a miscarriage is sometimes not needed if all the pregnancy tissue that was in the uterus comes out on its own, and there are no other problems such as infection or heavy bleeding. In other cases, this condition may be treated with:  Dilation and curettage (D&C). In this procedure, the cervix is stretched open and any remaining pregnancy tissue is removed from the lining of the uterus (endometrium).  Medicines. These may include: ? Antibiotic medicine, to treat infection. ? Medicine to help any remaining pregnancy tissue come out of the body. ? Medicine to reduce (contract) the size of the uterus. These medicines may be given if there is a lot of bleeding. If you have Rh-negative blood, you may be given an injection of a medicine called Rho(D) immune globulin. This medicine  helps prevent problems with future pregnancies. Follow these instructions at home: Medicines  Take over-the-counter and prescription medicines only as told by your health care provider.  If you were prescribed antibiotic medicine, take it as told by your health care provider. Do not stop taking the antibiotic even if  you start to feel better. Activity  Rest as told by your health care provider. Ask your health care provider what activities are safe for you.  Have someone help with home and family responsibilities during this time. General instructions  Monitor how much tissue or blood clot material comes out of the vagina.  Do not have sex, douche, or put anything, such as tampons, in your vagina until your health care provider says it is okay.  To help you and your partner with the grieving process, talk with your health care provider or get counseling.  When you are ready, meet with your health care provider to discuss any important steps you should take for your health. Also, discuss steps you should take to have a healthy pregnancy in the future.  Keep all follow-up visits. This is important.   Where to find more information  The [employer] of Obstetricians and Gynecologists: acog.org  U.S. Department of Health and Cytogeneticist of Women's Health: http://hoffman.com/ Contact a health care provider if:  You have a fever or chills.  There is bad-smelling fluid coming from the vagina.  You have more bleeding instead of less.  Tissue or blood clots come out of your vagina. Get help right away if:  You have severe cramps or pain in your back or abdomen.  Heavy bleeding soaks through 2 large sanitary pads an hour for more than 2 hours.  You become light-headed or weak.  You faint.  You feel sad, and your sadness takes over your thoughts.  You think about hurting yourself. If you ever feel like you may hurt yourself or others, or have thoughts about taking your own life, get help right away. Go to your nearest emergency department or:  Call your local emergency services (911 in the [place]  Call a suicide crisis helpline, such as the National Suicide Prevention Lifeline at (812) 848-8826. This is open 24 hours a day in the [place]  Text the Crisis Text Line at  972-207-2795 (in the [place] Summary  Most miscarriages happen in the first 3 months of pregnancy. Sometimes miscarriage happens before a woman knows that she is pregnant.  Follow instructions from your health care provider about medicines and activity.  To help you and your partner with grieving, talk with your health care provider or get counseling.  Keep all follow-up visits. This information is not intended to replace advice given to you by your health care provider. Make sure you discuss any questions you have with your health care provider. Document Revised: 09/08/2019 Document Reviewed: 09/08/2019 Elsevier Patient Education  2021 [employer].

## 2020-08-16 NOTE — Anesthesia Postprocedure Evaluation (Signed)
Anesthesia Post Note  Patient: Dawn Dawson  Procedure(s) Performed: DILATATION AND EVACUATION (N/A ) OPERATIVE ULTRASOUND (N/A )     Patient location during evaluation: PACU Anesthesia Type: General Level of consciousness: awake and alert, patient cooperative and oriented Pain management: pain level controlled Vital Signs Assessment: post-procedure vital signs reviewed and stable Respiratory status: spontaneous breathing, nonlabored ventilation and respiratory function stable Cardiovascular status: blood pressure returned to baseline and stable Postop Assessment: no apparent nausea or vomiting and adequate PO intake Anesthetic complications: no   No complications documented.  Last Vitals:  Vitals:   08/16/20 1723 08/16/20 1738  BP: 139/83 139/86  Pulse: 72 71  Resp: 19 18  Temp:    SpO2: 100% 100%    Last Pain:  Vitals:   08/16/20 1723  TempSrc:   PainSc: Asleep                 Dearl Rudden,E. Burnard Enis

## 2020-08-16 NOTE — Op Note (Signed)
Patient: Dawn Dawson. Kittle MRN: [id number] Date of birth: [birth year]  Date of procedure: 08/16/2020  Preop diagnosis:  1.Missed abortion of monochorionic monoamniotic twin pregnancy at 12 weeks 3 days EGA. 2.BMI of 55.8  Postop diagnosis: Same as above    Procedure:  1. Suction dilation and curettage under ultrasound guidance  Surgeon: Dr. Hoover Browns.  Anesthesia: General ETA  Complications: None  Input: 1000cc LR  Output: EBL: 500cc including products of conception.    Urine: 100 cc straight catheterization   Findings: Uterus 14 week sized. No palpable adnexal masses bilaterally. Cervix closed.   Indications: 35 year-old G1P0101 supposed to be [redacted] weeks pregnant who was found to have a missed abortion of  monochorionic monoamniotic twins measuring 12 weeks 3 days EGA here for a  suction dilation and curretage.  She was consented for the procedure after explaining the risks, benefits and alternatives of the procedure including risks of bleeding, infection, damage to organs, uterine scarring and Asherman's syndrome.  Her blood type was confirmed to be blood group A pos.      Procedure: She was taken to the operating room anesthesia was administered without difficulty. Oral doxycyline 200 mg was given preoperatively about 1 hour before the procedure.  She was placed in the dorsal lithotomy position and prepped and draped in the usual sterile fashion.   Straight catheterization was performed.  Ultrasound done confirmed lack of fetal heart beats.  Graves speculum was placed in and cervix grasped with ring forcep anteriorly.  Marcaine with epinephrine was given as a paracervical block, total 10 cc.  Cervix was dilated to the # 36 pratt.  # 12 suction currett advanced through cervix to fundus and electric suction initiated to green zone, 600 mm Hg.  With ultrasound guidance it was noted that the fetal tissue was not effectively removed. Therefore the #14 suction curette was used with effective  removal of fetal tissue.  Brisk bleeding was noted between the curette passes and one dose of methergine was given IM after confirming normal blood pressures of the patient. Gentle sharp currettage was done and uterine walls was noted to be gritty on all four quadrants. Suction was done with the number 12 suction curette until endometrial lining was noted to be thin and there was no more fetal tissue coming out.  Excellent hemostasis was noted.  All the the instruments were then removed and the patient was then awoken from anesthesia she was cleaned and taken to recovery room in stable condition.  Specimen: Products of conception.   Dr. Sallye Ober, MD.   08/16/2020.  [id number].

## 2020-08-16 NOTE — Interval H&P Note (Signed)
History and Physical Interval Note:  08/16/2020 3:47 PM  Dawn Dawson  has presented today for surgery, with the diagnosis of MISCARRIAGE.  The various methods of treatment have been discussed with the patient and family. After consideration of risks, benefits and other options for treatment, the patient has consented to  Procedure(s) with comments: DILATATION AND EVACUATION (N/A) - PROVIDER NEEDS ULTRASOUND GUIDANCE FOR PROCEDURE OPERATIVE ULTRASOUND (N/A) as a surgical intervention.  The patient's history has been reviewed, patient examined, no change in status, stable for surgery.  I have reviewed the patient's chart and labs.  Questions were answered to the patient's satisfaction.    Prescilla Sours, MD.

## 2020-08-17 ENCOUNTER — Encounter (HOSPITAL_COMMUNITY): Payer: Self-pay | Admitting: Obstetrics & Gynecology

## 2020-08-17 LAB — RPR: RPR Ser Ql: NONREACTIVE

## 2020-08-20 LAB — SURGICAL PATHOLOGY

## 2020-09-12 ENCOUNTER — Other Ambulatory Visit: Payer: 59

## 2020-09-12 ENCOUNTER — Ambulatory Visit: Payer: 59

## 2020-09-16 ENCOUNTER — Ambulatory Visit: Payer: BC Managed Care – PPO

## 2023-09-01 ENCOUNTER — Ambulatory Visit: Admitting: Plastic Surgery

## 2023-09-01 ENCOUNTER — Encounter: Payer: Self-pay | Admitting: Plastic Surgery

## 2023-09-01 VITALS — BP 115/78 | HR 72 | Ht 63.5 in | Wt 199.4 lb

## 2023-09-01 DIAGNOSIS — E65 Localized adiposity: Secondary | ICD-10-CM

## 2023-09-01 DIAGNOSIS — M793 Panniculitis, unspecified: Secondary | ICD-10-CM

## 2023-09-01 NOTE — Progress Notes (Signed)
 Referring Provider Brigida Canal, MD [address] [place],  [place] [address]   CC:  Chief Complaint  Patient presents with   Advice Only      Dawn Dawson is an 38 y.o. female.  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  She had difficulty with rashes under her pannus prior to surgery but this is worsened with her 120 pound weight loss.  She has seen her primary care provider who has documented the use of nystatin creams and powders and the fact that they have exhausted the use of these topical medications in the treatment of her rashes.  Additionally  it is  difficult for her to find close that fit appropriately.  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  She is currently stable at her goal weight.  She would like to have the excess skin and fat on her anterior abdominal wall removed.  She is not on any blood thinners and does not smoke.  She does not have a history of DVT.  No Known Allergies  Outpatient Encounter Medications as of 09/01/2023  Medication Sig   acetaminophen  (TYLENOL ) 500 MG tablet Take 500 mg by mouth every 6 (six) hours as needed for mild pain or moderate pain.   Cholecalciferol 125 MCG (5000 UT) TABS Take 1 tablet every day by oral route.   clonazePAM (KLONOPIN) 0.5 MG tablet Take 0.5 mg by mouth daily.   clotrimazole (LOTRIMIN) 1 % cream    losartan (COZAAR) 50 MG tablet Take 50 mg by mouth daily.   Menthol -Zinc Oxide (CALMOSEPTINE) 0.44-20.6 % OINT Apply 1 application twice a day by topical route for 30 days, for Panniculitis rash.   nystatin (MYCOSTATIN/NYSTOP) powder Apply topically 2 (two) times daily.   nystatin cream (MYCOSTATIN) Apply topically 2 (two) times daily.   ondansetron  (ZOFRAN ) 4 MG tablet Take 1 tablet by mouth every 8 (eight) hours as needed.   phentermine 15 MG capsule Take 15 mg by mouth every morning.   Prenatal Vit-Fe Fumarate-FA  (PRENATAL VITAMIN PO)    Vitamin D, Ergocalciferol, (DRISDOL) 1.25 MG (50000 UNIT) CAPS capsule Take 50,000 Units by mouth once a week.   [DISCONTINUED] ondansetron  (ZOFRAN -ODT) 8 MG disintegrating tablet Take 8 mg by mouth daily as needed for nausea.   [DISCONTINUED] docusate sodium  (COLACE) 100 MG capsule Take 200 mg by mouth daily as needed for mild constipation. (Patient not taking: Reported on 09/01/2023)   [DISCONTINUED] ferrous sulfate  325 (65 FE) MG tablet Take 1 tablet (325 mg total) by mouth every other day. (Patient not taking: Reported on 09/01/2023)   [DISCONTINUED] ibuprofen  (ADVIL ) 800 MG tablet Take 1 tablet (800 mg total) by mouth every 8 (eight) hours as needed for mild pain, moderate pain or cramping. (Patient not taking: Reported on 09/01/2023)   [DISCONTINUED] labetalol  (NORMODYNE ) 100 MG tablet Take 100 mg by mouth 2 (two) times daily. (Patient not taking: Reported on 09/01/2023)   [DISCONTINUED] oxyCODONE  (OXY IR/ROXICODONE ) 5 MG immediate release tablet Take 1 tablet (5 mg total) by mouth every 4 (four) hours as needed for severe pain or breakthrough pain. (Patient not taking: Reported on 09/01/2023)   No facility-administered encounter medications on file as of 09/01/2023.     Past Medical History:  Diagnosis Date   Anxiety    Heart murmur    as child   HTN (hypertension)  Stopped Aldomet  w/ positive UPT then restarted  Oct. 2013   Hypertension 2012   WAS TAKING ALDOMET    Obese 10/04/2011   pregravid BMI=48   Urinary tract infection     Past Surgical History:  Procedure Laterality Date   CESAREAN SECTION  04/10/2012   Procedure: CESAREAN SECTION;  Surgeon: Madelene Schanz, MD;  Location: [facility];  Service: Obstetrics;  Laterality: N/A;  Primary cesarean section of baby girl at 2223 APGAR 8/8   CHOLECYSTECTOMY OPEN  2007   NO COMPLICATIONS   DILATION AND EVACUATION N/A 08/16/2020   Procedure: DILATATION AND EVACUATION;  Surgeon: Vernal Gold, MD;  Location: [facility];   Service: Gynecology;  Laterality: N/A;  PROVIDER NEEDS ULTRASOUND GUIDANCE FOR PROCEDURE   OPERATIVE ULTRASOUND N/A 08/16/2020   Procedure: OPERATIVE ULTRASOUND;  Surgeon: Vernal Gold, MD;  Location: [facility];  Service: Gynecology;  Laterality: N/A;   WISDOM TOOTH EXTRACTION     ALL 4 EXTRACTED    Family History  Problem Relation Age of Onset   Kidney disease Maternal Grandmother        DIALYSIS   Hypertension Father    Seizures Maternal Uncle    Emphysema Maternal Uncle    Emphysema Cousin        MATERNAL   Other Neg Hx     Social History   Social History Narrative   Not on file     Review of Systems General: Denies fevers, chills, weight loss CV: Denies chest pain, shortness of breath, palpitations Abdomen: Excess skin and fat on the anterior abdominal wall which often has rashes down into the intertriginous regions.  The pannus interferes with her ability to clean herself and interferes with her daily activities.  Physical Exam    09/01/2023   11:26 AM 08/16/2020    5:53 PM 08/16/2020    5:38 PM  Vitals with BMI  Height 5' 3.5    Weight 199 lbs 6 oz    BMI 34.76    Systolic 115 145 161  Diastolic 78 99 86  Pulse 72 69 71    General:  No acute distress,  Alert and oriented, Non-Toxic, Normal speech and affect Abdomen: Patient has a significant amount of excess skin in the anterior abdominal wall.  Her pannus hangs to her mid thighs.  She has darkened scars on the posterior aspect of the pannus consistent with previous infections.  This is documented in her photographs. Mammogram: Not applicable due to age Assessment/Plan Panniculitis: Patient has a very large pannus and I believe that she would benefit from a panniculectomy.  We discussed panniculectomy as at length.  Her husband was able to join by FaceTime.  I showed her the location of the incision and what I anticipated removing at the time of panniculectomy.  We discussed scars and the unpredictable nature of wound  healing.  We discussed the risks of bleeding, infection, seroma formation.  She understands I will use drains postoperatively and these may be in place for 2 to 4 weeks.  She will also need compression of the incision for 6 weeks.  We did discuss the fact that patients who have undergone bariatric surgery have a higher rate of wound healing issues often requiring dressing changes.  She understands that this will not address anything above the umbilicus.  We discussed the postoperative limitations including no heavy lifting greater than 20 pounds, no vigorous activity, no submerging incisions in water for 6 weeks.  She may return to light activity  as tolerated though I have suggest that she may need to take at least a week off from work.  We have discussed the importance of ambulating immediately after surgery to help decrease the risk of DVT.  All questions were answered to her satisfaction.  Photographs were obtained today with her consent.  Will submit her for panniculectomy at her request.  Teretha Ferguson 09/01/2023, 12:11 PM

## 2023-11-16 ENCOUNTER — Institutional Professional Consult (permissible substitution): Admitting: Plastic Surgery

## 2024-01-18 ENCOUNTER — Ambulatory Visit: Admitting: Student

## 2024-01-18 VITALS — BP 115/77 | HR 76 | Ht 63.0 in | Wt 206.4 lb

## 2024-01-18 DIAGNOSIS — M793 Panniculitis, unspecified: Secondary | ICD-10-CM

## 2024-01-18 MED ORDER — ONDANSETRON HCL 4 MG PO TABS: 4.0000 mg | ORAL_TABLET | Freq: Three times a day (TID) | ORAL | 0 refills | Status: AC | PRN

## 2024-01-18 MED ORDER — OXYCODONE HCL 5 MG PO TABS: 5.0000 mg | ORAL_TABLET | Freq: Four times a day (QID) | ORAL | 0 refills | Status: DC | PRN

## 2024-01-18 MED ORDER — ONDANSETRON HCL 4 MG PO TABS: 4.0000 mg | ORAL_TABLET | Freq: Three times a day (TID) | ORAL | 0 refills | Status: DC | PRN

## 2024-01-18 MED ORDER — OXYCODONE HCL 5 MG PO TABS: 5.0000 mg | ORAL_TABLET | Freq: Four times a day (QID) | ORAL | 0 refills | Status: AC | PRN

## 2024-01-18 NOTE — H&P (View-Only) (Signed)
 Patient ID: Dawn Dawson, female    DOB: February 24, 1986, 38 y.o.   MRN: [id number]  Chief Complaint  Patient presents with   Pre-op Exam      ICD-10-CM   1. Panniculitis  M79.3        History of Present Illness: Dawn Dawson is a 38 y.o.  female  with a history of panniculitis.  She presents for preoperative evaluation for upcoming procedure, panniculectomy, scheduled for 02/08/2024 with Dr. Waddell.  The patient has not had problems with anesthesia.  Patient denies any history of cardiac disease.  She denies taking any blood thinners.  Patient reports she is not a smoker.  Patient denies taking any birth control or hormone replacement.  She denies any history of miscarriages.  She denies any personal or family history of blood clots or clotting diseases.  She denies any recent surgeries, traumas or infections.  She denies any history of stroke or heart attack.  She denies any history of Crohn's disease or ulcerative colitis, COPD or asthma.  She denies any history of cancer.  She denies any varicosities to her lower extremities.  She denies any recent fevers, chills or changes in her health.  Summary of Previous Visit: Patient was seen for initial consult by Dr. Waddell on 09/01/2023.  At this visit, patient reported that she had a gastric sleeve about 1.5 years ago.  Patient reported she had difficulty with rashes underneath her pannus prior to the surgery, but it worsened with her 120 pound weight loss.  Patient was requesting to have the excess skin and fat on her anterior abdominal wall moved.  Plan was to go ahead and move forward with panniculectomy.  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  She understands that she will have restrictions for 6 weeks.  PMH Significant for: Hypertension, panniculitis   Past Medical History: Allergies: No Known Allergies  Current Medications:  Current Outpatient Medications:     acetaminophen  (TYLENOL ) 500 MG tablet, Take 500 mg by mouth every 6 (six) hours as needed for mild pain or moderate pain., Disp: , Rfl:    Cholecalciferol 125 MCG (5000 UT) TABS, Take 1 tablet every day by oral route., Disp: , Rfl:    clonazePAM (KLONOPIN) 0.5 MG tablet, Take 0.5 mg by mouth daily., Disp: , Rfl:    clotrimazole (LOTRIMIN) 1 % cream, , Disp: , Rfl:    losartan (COZAAR) 50 MG tablet, Take 50 mg by mouth daily., Disp: , Rfl:    Menthol -Zinc Oxide (CALMOSEPTINE) 0.44-20.6 % OINT, Apply 1 application twice a day by topical route for 30 days, for Panniculitis rash., Disp: , Rfl:    Multiple Vitamin (MULTI VITAMIN DAILY PO), Take by mouth., Disp: , Rfl:    nystatin (MYCOSTATIN/NYSTOP) powder, Apply topically 2 (two) times daily., Disp: , Rfl:    nystatin cream (MYCOSTATIN), Apply topically 2 (two) times daily., Disp: , Rfl:    phentermine 15 MG capsule, Take 15 mg by mouth every morning., Disp: , Rfl:    Vitamin D, Ergocalciferol, (DRISDOL) 1.25 MG (50000 UNIT) CAPS capsule, Take 50,000 Units by mouth once a week., Disp: , Rfl:    ondansetron  (ZOFRAN ) 4 MG tablet, Take 1 tablet (4 mg total) by mouth every 8 (eight) hours as needed for up to 20 doses for nausea or vomiting., Disp: 20 tablet, Rfl: 0   oxyCODONE  (ROXICODONE ) 5 MG immediate release  tablet, Take 1 tablet (5 mg total) by mouth every 6 (six) hours as needed for up to 15 doses for severe pain (pain score 7-10)., Disp: 15 tablet, Rfl: 0  Past Medical Problems: Past Medical History:  Diagnosis Date   Anxiety    Heart murmur    as child   HTN (hypertension)    Stopped Aldomet  w/ positive UPT then restarted  Oct. 2013   Hypertension 2012   WAS TAKING ALDOMET    Obese 10/04/2011   pregravid BMI=48   Urinary tract infection     Past Surgical History: Past Surgical History:  Procedure Laterality Date   CESAREAN SECTION  04/10/2012   Procedure: CESAREAN SECTION;  Surgeon: Jon CINDERELLA Rummer, MD;  Location: [facility];  Service:  Obstetrics;  Laterality: N/A;  Primary cesarean section of baby girl at 2223 [address]GAR 8/8   CHOLECYSTECTOMY OPEN  2007   NO COMPLICATIONS   DILATION AND EVACUATION N/A 08/16/2020   Procedure: DILATATION AND EVACUATION;  Surgeon: Gloriann Chick, MD;  Location: [facility];  Service: Gynecology;  Laterality: N/A;  PROVIDER NEEDS ULTRASOUND GUIDANCE FOR PROCEDURE   OPERATIVE ULTRASOUND N/A 08/16/2020   Procedure: OPERATIVE ULTRASOUND;  Surgeon: Gloriann Chick, MD;  Location: [facility];  Service: Gynecology;  Laterality: N/A;   WISDOM TOOTH EXTRACTION     ALL 4 EXTRACTED    Social History: Social History   Socioeconomic History   Marital status: Married    Spouse name: TRINITA DEVLIN   Number of children: 0   Years of education: 16   Highest education level: Not on file  Occupational History   Occupation: LPN  Tobacco Use   Smoking status: Never   Smokeless tobacco: Never  Vaping Use   Vaping status: Never Used  Substance and Sexual Activity   Alcohol use: Not Currently    Comment: SOCIAL DRINKER ONLY   Drug use: No   Sexual activity: Yes    Partners: Male    Birth control/protection: None    Comment: currently pregnant  Other Topics Concern   Not on file  Social History Narrative   Not on file   Social Drivers of Health   Financial Resource Strain: Low Risk  (01/02/2022)   Received from [facility]   Overall Financial Resource Strain (CARDIA)    Difficulty of Paying Living Expenses: Not hard at all  Food Insecurity: No Food Insecurity (01/02/2022)   Received from [facility]   Hunger Vital Sign    Within the past 12 months, you worried that your food would run out before you got the money to buy more.: Never true    Within the past 12 months, the food you bought just didn't last and you didn't have money to get more.: Never true  Transportation Needs: No Transportation Needs (01/02/2022)   Received from [facility]   [facility] - Transportation    Lack of Transportation (Medical): No    Lack of Transportation (Non-Medical): No  Physical Activity: Insufficiently Active (01/02/2022)   Received from [facility]   Exercise Vital Sign    On average, how many days per week do you engage in moderate to strenuous exercise (like a brisk walk)?: 2 days    On average, how many minutes do you engage in exercise at this level?: 30 min  Stress: No Stress Concern Present (01/02/2022)   Received from [facility]  [facility]   [facility] - Occupational Stress Questionnaire    Feeling of Stress : Not at all  Social Connections: Moderately Integrated (01/02/2022)   Received from [facility]   Social Connection and Isolation Panel    In a typical week, how many times do you talk on the phone with family, friends, or neighbors?: More than three times a week    How often do you get together with friends or relatives?: More than three times a week    How often do you attend church or religious services?: More than 4 times per year    Do you belong to any clubs or organizations such as church groups, unions, fraternal or athletic groups, or school groups?: No    How often do you attend meetings of the clubs or organizations you belong to?: Never    Are you married, widowed, divorced, separated, never married, or living with a partner?: Married  Catering Manager Violence: Not on file    Family History: Family History  Problem Relation Age of Onset   Kidney disease Maternal Grandmother        DIALYSIS   Hypertension Father    Seizures Maternal Uncle    Emphysema Maternal Uncle    Emphysema Cousin        MATERNAL   Other Neg Hx     Review of Systems: Denies any recent fevers, chills or changes in her health  Physical Exam: Vital Signs BP 115/77 (BP Location: Left Arm, Patient Position: Sitting, Cuff Size: Large)    Pulse 76   Ht 5' 3 (1.6 m)   Wt 206 lb 6.4 oz (93.6 kg)   SpO2 100%   BMI 36.56 kg/m   Physical Exam  Constitutional:      General: Not in acute distress.    Appearance: Normal appearance. Not ill-appearing.  HENT:     Head: Normocephalic and atraumatic.  Eyes:     Pupils: Pupils are equal, round Neck:     Musculoskeletal: Normal range of motion.  Cardiovascular:     Rate and Rhythm: Normal rate Pulmonary:     Effort: Pulmonary effort is normal. No respiratory distress.  Abdominal:     General: Abdomen is flat. There is no distension.  Musculoskeletal: Normal range of motion.  Skin:    General: Skin is warm and dry.     Findings: No erythema or rash.  Neurological:     Mental Status: Alert and oriented to person, place, and time. Mental status is at baseline.  Psychiatric:        Mood and Affect: Mood normal.        Behavior: Behavior normal.    Assessment/Plan: The patient is scheduled for panniculectomy with Dr. Waddell.  Risks, benefits, and alternatives of procedure discussed, questions answered and consent obtained.    Smoking Status: Non-smoker; Counseling Given?  N/A  Caprini Score: 3; Risk Factors include: BMI > 25, and length of planned surgery. Recommendation for mechanical prophylaxis. Encourage early ambulation.   Pictures obtained: @consult   Post-op Rx sent to pharmacy: Oxycodone , Zofran   Instructed patient to hold any multivitamins or supplements at least 1 week prior to surgery.  Instructed patient to hold phentermine at least 2 weeks prior to surgery.  Discussed with her to hold her losartan the day of surgery.  Patient expressed understanding.  Discussed with her to not take Klonopin at the same time as pain medication as these can be sedating.  She expressed understanding.  Patient was  provided with the General Surgical Risk consent document and Pain Medication Agreement prior to their appointment.  They had adequate time to read through the risk  consent documents and Pain Medication Agreement. We also discussed them in person together during this preop appointment. All of their questions were answered to their satisfaction.  Recommended calling if they have any further questions.  Risk consent form and Pain Medication Agreement to be scanned into patient's chart.  The consent was obtained with risks and complications reviewed which included bleeding, pain, scar, infection and the risk of anesthesia.  The patients questions were answered to the patients expressed satisfaction.    Electronically signed by: Estefana FORBES Peck, PA-C 01/18/2024 12:55 PM

## 2024-01-18 NOTE — Progress Notes (Signed)
 Patient ID: Dawn Dawson, female    DOB: February 24, 1986, 38 y.o.   MRN: [id number]  Chief Complaint  Patient presents with   Pre-op Exam      ICD-10-CM   1. Panniculitis  M79.3        History of Present Illness: Dawn Dawson is a 38 y.o.  female  with a history of panniculitis.  She presents for preoperative evaluation for upcoming procedure, panniculectomy, scheduled for 02/08/2024 with Dr. Waddell.  The patient has not had problems with anesthesia.  Patient denies any history of cardiac disease.  She denies taking any blood thinners.  Patient reports she is not a smoker.  Patient denies taking any birth control or hormone replacement.  She denies any history of miscarriages.  She denies any personal or family history of blood clots or clotting diseases.  She denies any recent surgeries, traumas or infections.  She denies any history of stroke or heart attack.  She denies any history of Crohn's disease or ulcerative colitis, COPD or asthma.  She denies any history of cancer.  She denies any varicosities to her lower extremities.  She denies any recent fevers, chills or changes in her health.  Summary of Previous Visit: Patient was seen for initial consult by Dr. Waddell on 09/01/2023.  At this visit, patient reported that she had a gastric sleeve about 1.5 years ago.  Patient reported she had difficulty with rashes underneath her pannus prior to the surgery, but it worsened with her 120 pound weight loss.  Patient was requesting to have the excess skin and fat on her anterior abdominal wall moved.  Plan was to go ahead and move forward with panniculectomy.  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  She understands that she will have restrictions for 6 weeks.  PMH Significant for: Hypertension, panniculitis   Past Medical History: Allergies: No Known Allergies  Current Medications:  Current Outpatient Medications:     acetaminophen  (TYLENOL ) 500 MG tablet, Take 500 mg by mouth every 6 (six) hours as needed for mild pain or moderate pain., Disp: , Rfl:    Cholecalciferol 125 MCG (5000 UT) TABS, Take 1 tablet every day by oral route., Disp: , Rfl:    clonazePAM (KLONOPIN) 0.5 MG tablet, Take 0.5 mg by mouth daily., Disp: , Rfl:    clotrimazole (LOTRIMIN) 1 % cream, , Disp: , Rfl:    losartan (COZAAR) 50 MG tablet, Take 50 mg by mouth daily., Disp: , Rfl:    Menthol -Zinc Oxide (CALMOSEPTINE) 0.44-20.6 % OINT, Apply 1 application twice a day by topical route for 30 days, for Panniculitis rash., Disp: , Rfl:    Multiple Vitamin (MULTI VITAMIN DAILY PO), Take by mouth., Disp: , Rfl:    nystatin (MYCOSTATIN/NYSTOP) powder, Apply topically 2 (two) times daily., Disp: , Rfl:    nystatin cream (MYCOSTATIN), Apply topically 2 (two) times daily., Disp: , Rfl:    phentermine 15 MG capsule, Take 15 mg by mouth every morning., Disp: , Rfl:    Vitamin D, Ergocalciferol, (DRISDOL) 1.25 MG (50000 UNIT) CAPS capsule, Take 50,000 Units by mouth once a week., Disp: , Rfl:    ondansetron  (ZOFRAN ) 4 MG tablet, Take 1 tablet (4 mg total) by mouth every 8 (eight) hours as needed for up to 20 doses for nausea or vomiting., Disp: 20 tablet, Rfl: 0   oxyCODONE  (ROXICODONE ) 5 MG immediate release  tablet, Take 1 tablet (5 mg total) by mouth every 6 (six) hours as needed for up to 15 doses for severe pain (pain score 7-10)., Disp: 15 tablet, Rfl: 0  Past Medical Problems: Past Medical History:  Diagnosis Date   Anxiety    Heart murmur    as child   HTN (hypertension)    Stopped Aldomet  w/ positive UPT then restarted  Oct. 2013   Hypertension 2012   WAS TAKING ALDOMET    Obese 10/04/2011   pregravid BMI=48   Urinary tract infection     Past Surgical History: Past Surgical History:  Procedure Laterality Date   CESAREAN SECTION  04/10/2012   Procedure: CESAREAN SECTION;  Surgeon: Jon CINDERELLA Rummer, MD;  Location: [facility];  Service:  Obstetrics;  Laterality: N/A;  Primary cesarean section of baby girl at 2223 APGAR 8/8   CHOLECYSTECTOMY OPEN  2007   NO COMPLICATIONS   DILATION AND EVACUATION N/A 08/16/2020   Procedure: DILATATION AND EVACUATION;  Surgeon: Gloriann Chick, MD;  Location: [facility] OR;  Service: Gynecology;  Laterality: N/A;  PROVIDER NEEDS ULTRASOUND GUIDANCE FOR PROCEDURE   OPERATIVE ULTRASOUND N/A 08/16/2020   Procedure: OPERATIVE ULTRASOUND;  Surgeon: Gloriann Chick, MD;  Location: [facility] OR;  Service: Gynecology;  Laterality: N/A;   WISDOM TOOTH EXTRACTION     ALL 4 EXTRACTED    Social History: Social History   Socioeconomic History   Marital status: Married    Spouse name: TRINITA DEVLIN   Number of children: 0   Years of education: 16   Highest education level: Not on file  Occupational History   Occupation: LPN  Tobacco Use   Smoking status: Never   Smokeless tobacco: Never  Vaping Use   Vaping status: Never Used  Substance and Sexual Activity   Alcohol use: Not Currently    Comment: SOCIAL DRINKER ONLY   Drug use: No   Sexual activity: Yes    Partners: Male    Birth control/protection: None    Comment: currently pregnant  Other Topics Concern   Not on file  Social History Narrative   Not on file   Social Drivers of Health   Financial Resource Strain: Low Risk  (01/02/2022)   Received from [facility]   Overall Financial Resource Strain (CARDIA)    Difficulty of Paying Living Expenses: Not hard at all  Food Insecurity: No Food Insecurity (01/02/2022)   Received from [facility]   Hunger Vital Sign    Within the past 12 months, you worried that your food would run out before you got the money to buy more.: Never true    Within the past 12 months, the food you bought just didn't last and you didn't have money to get more.: Never true  Transportation Needs: No Transportation Needs (01/02/2022)   Received from [facility]   [facility] - Transportation    Lack of Transportation (Medical): No    Lack of Transportation (Non-Medical): No  Physical Activity: Insufficiently Active (01/02/2022)   Received from [facility]   Exercise Vital Sign    On average, how many days per week do you engage in moderate to strenuous exercise (like a brisk walk)?: 2 days    On average, how many minutes do you engage in exercise at this level?: 30 min  Stress: No Stress Concern Present (01/02/2022)   Received from [facility]  [facility]   [facility] - Occupational Stress Questionnaire    Feeling of Stress : Not at all  Social Connections: Moderately Integrated (01/02/2022)   Received from [facility]   Social Connection and Isolation Panel    In a typical week, how many times do you talk on the phone with family, friends, or neighbors?: More than three times a week    How often do you get together with friends or relatives?: More than three times a week    How often do you attend church or religious services?: More than 4 times per year    Do you belong to any clubs or organizations such as church groups, unions, fraternal or athletic groups, or school groups?: No    How often do you attend meetings of the clubs or organizations you belong to?: Never    Are you married, widowed, divorced, separated, never married, or living with a partner?: Married  Catering Manager Violence: Not on file    Family History: Family History  Problem Relation Age of Onset   Kidney disease Maternal Grandmother        DIALYSIS   Hypertension Father    Seizures Maternal Uncle    Emphysema Maternal Uncle    Emphysema Cousin        MATERNAL   Other Neg Hx     Review of Systems: Denies any recent fevers, chills or changes in her health  Physical Exam: Vital Signs BP 115/77 (BP Location: Left Arm, Patient Position: Sitting, Cuff Size: Large)    Pulse 76   Ht 5' 3 (1.6 m)   Wt 206 lb 6.4 oz (93.6 kg)   SpO2 100%   BMI 36.56 kg/m   Physical Exam  Constitutional:      General: Not in acute distress.    Appearance: Normal appearance. Not ill-appearing.  HENT:     Head: Normocephalic and atraumatic.  Eyes:     Pupils: Pupils are equal, round Neck:     Musculoskeletal: Normal range of motion.  Cardiovascular:     Rate and Rhythm: Normal rate Pulmonary:     Effort: Pulmonary effort is normal. No respiratory distress.  Abdominal:     General: Abdomen is flat. There is no distension.  Musculoskeletal: Normal range of motion.  Skin:    General: Skin is warm and dry.     Findings: No erythema or rash.  Neurological:     Mental Status: Alert and oriented to person, place, and time. Mental status is at baseline.  Psychiatric:        Mood and Affect: Mood normal.        Behavior: Behavior normal.    Assessment/Plan: The patient is scheduled for panniculectomy with Dr. Waddell.  Risks, benefits, and alternatives of procedure discussed, questions answered and consent obtained.    Smoking Status: Non-smoker; Counseling Given?  N/A  Caprini Score: 3; Risk Factors include: BMI > 25, and length of planned surgery. Recommendation for mechanical prophylaxis. Encourage early ambulation.   Pictures obtained: @consult   Post-op Rx sent to pharmacy: Oxycodone , Zofran   Instructed patient to hold any multivitamins or supplements at least 1 week prior to surgery.  Instructed patient to hold phentermine at least 2 weeks prior to surgery.  Discussed with her to hold her losartan the day of surgery.  Patient expressed understanding.  Discussed with her to not take Klonopin at the same time as pain medication as these can be sedating.  She expressed understanding.  Patient was  provided with the General Surgical Risk consent document and Pain Medication Agreement prior to their appointment.  They had adequate time to read through the risk  consent documents and Pain Medication Agreement. We also discussed them in person together during this preop appointment. All of their questions were answered to their satisfaction.  Recommended calling if they have any further questions.  Risk consent form and Pain Medication Agreement to be scanned into patient's chart.  The consent was obtained with risks and complications reviewed which included bleeding, pain, scar, infection and the risk of anesthesia.  The patients questions were answered to the patients expressed satisfaction.    Electronically signed by: Estefana FORBES Peck, PA-C 01/18/2024 12:55 PM

## 2024-02-02 ENCOUNTER — Encounter (HOSPITAL_BASED_OUTPATIENT_CLINIC_OR_DEPARTMENT_OTHER): Payer: Self-pay | Admitting: Plastic Surgery

## 2024-02-08 ENCOUNTER — Ambulatory Visit (HOSPITAL_BASED_OUTPATIENT_CLINIC_OR_DEPARTMENT_OTHER): Admitting: Anesthesiology

## 2024-02-08 ENCOUNTER — Encounter (HOSPITAL_BASED_OUTPATIENT_CLINIC_OR_DEPARTMENT_OTHER): Payer: Self-pay | Admitting: Plastic Surgery

## 2024-02-08 ENCOUNTER — Ambulatory Visit (HOSPITAL_BASED_OUTPATIENT_CLINIC_OR_DEPARTMENT_OTHER)
Admission: RE | Admit: 2024-02-08 | Discharge: 2024-02-08 | Disposition: A | Discharge status: Home / Self Care | Attending: Plastic Surgery | Admitting: Plastic Surgery

## 2024-02-08 ENCOUNTER — Encounter (HOSPITAL_BASED_OUTPATIENT_CLINIC_OR_DEPARTMENT_OTHER)
Admission: RE | Disposition: A | Discharge status: Home / Self Care | Payer: Self-pay | Source: Home / Self Care | Attending: Plastic Surgery

## 2024-02-08 DIAGNOSIS — R21 Rash and other nonspecific skin eruption: Secondary | ICD-10-CM | POA: Diagnosis not present

## 2024-02-08 DIAGNOSIS — I10 Essential (primary) hypertension: Secondary | ICD-10-CM | POA: Diagnosis not present

## 2024-02-08 DIAGNOSIS — F419 Anxiety disorder, unspecified: Secondary | ICD-10-CM | POA: Diagnosis not present

## 2024-02-08 DIAGNOSIS — Z79899 Other long term (current) drug therapy: Secondary | ICD-10-CM | POA: Diagnosis not present

## 2024-02-08 DIAGNOSIS — M793 Panniculitis, unspecified: Secondary | ICD-10-CM | POA: Insufficient documentation

## 2024-02-08 HISTORY — PX: PANNICULECTOMY: SHX5360

## 2024-02-08 LAB — POCT PREGNANCY, URINE: Preg Test, Ur: NEGATIVE

## 2024-02-08 SURGERY — PANNICULECTOMY
Anesthesia: General | Site: Abdomen

## 2024-02-08 MED ORDER — FENTANYL CITRATE (PF) 100 MCG/2ML IJ SOLN
INTRAMUSCULAR | Status: AC
  Filled 2024-02-08: qty 2

## 2024-02-08 MED ORDER — DEXAMETHASONE SODIUM PHOSPHATE 4 MG/ML IJ SOLN
INTRAMUSCULAR | Status: DC | PRN
  Administered 2024-02-08: 5 mg via INTRAVENOUS

## 2024-02-08 MED ORDER — SODIUM CHLORIDE (PF) 0.9 % IJ SOLN
INTRAMUSCULAR | Status: DC | PRN
  Administered 2024-02-08: 100 mL

## 2024-02-08 MED ORDER — ACETAMINOPHEN 10 MG/ML IV SOLN: 1000.0000 mg | Freq: Once | INTRAVENOUS | Status: DC | PRN

## 2024-02-08 MED ORDER — CEFAZOLIN SODIUM-DEXTROSE 2-4 GM/100ML-% IV SOLN
2.0000 g | INTRAVENOUS | Status: AC
  Administered 2024-02-08: 2 g via INTRAVENOUS

## 2024-02-08 MED ORDER — FENTANYL CITRATE (PF) 100 MCG/2ML IJ SOLN
25.0000 ug | INTRAMUSCULAR | Status: DC | PRN
  Administered 2024-02-08 (×2): 50 ug via INTRAVENOUS

## 2024-02-08 MED ORDER — ONDANSETRON HCL 4 MG/2ML IJ SOLN
INTRAMUSCULAR | Status: AC
  Filled 2024-02-08: qty 2

## 2024-02-08 MED ORDER — PROPOFOL 10 MG/ML IV BOLUS
INTRAVENOUS | Status: DC | PRN
Start: 2024-02-08 — End: 2024-02-08
  Administered 2024-02-08: 120 mg via INTRAVENOUS

## 2024-02-08 MED ORDER — TRANEXAMIC ACID 1000 MG/10ML IV SOLN
Status: DC | PRN
  Administered 2024-02-08: 3000 mg via TOPICAL

## 2024-02-08 MED ORDER — PHENYLEPHRINE 80 MCG/ML (10ML) SYRINGE FOR IV PUSH (FOR BLOOD PRESSURE SUPPORT)
PREFILLED_SYRINGE | INTRAVENOUS | Status: AC
  Filled 2024-02-08: qty 10

## 2024-02-08 MED ORDER — LIDOCAINE 2% (20 MG/ML) 5 ML SYRINGE
INTRAMUSCULAR | Status: DC | PRN
Start: 2024-02-08 — End: 2024-02-08
  Administered 2024-02-08: 100 mg via INTRAVENOUS

## 2024-02-08 MED ORDER — HYDROMORPHONE HCL 1 MG/ML IJ SOLN
INTRAMUSCULAR | Status: DC | PRN
  Administered 2024-02-08: .5 mg via INTRAVENOUS

## 2024-02-08 MED ORDER — TRANEXAMIC ACID 1000 MG/10ML IV SOLN
INTRAVENOUS | Status: AC
  Filled 2024-02-08: qty 30

## 2024-02-08 MED ORDER — CEFAZOLIN SODIUM-DEXTROSE 2-4 GM/100ML-% IV SOLN
INTRAVENOUS | Status: AC
  Filled 2024-02-08: qty 100

## 2024-02-08 MED ORDER — LACTATED RINGERS IV SOLN: INTRAVENOUS | Status: DC

## 2024-02-08 MED ORDER — ACETAMINOPHEN 10 MG/ML IV SOLN
INTRAVENOUS | Status: DC | PRN
  Administered 2024-02-08: 1000 mg via INTRAVENOUS

## 2024-02-08 MED ORDER — SUCCINYLCHOLINE CHLORIDE 200 MG/10ML IV SOSY
PREFILLED_SYRINGE | INTRAVENOUS | Status: AC
  Filled 2024-02-08: qty 10

## 2024-02-08 MED ORDER — DEXMEDETOMIDINE HCL IN NACL 80 MCG/20ML IV SOLN
INTRAVENOUS | Status: DC | PRN
Start: 2024-02-08 — End: 2024-02-08
  Administered 2024-02-08 (×2): 8 ug via INTRAVENOUS

## 2024-02-08 MED ORDER — MIDAZOLAM HCL 2 MG/2ML IJ SOLN
INTRAMUSCULAR | Status: AC
  Filled 2024-02-08: qty 2

## 2024-02-08 MED ORDER — ATROPINE SULFATE 0.4 MG/ML IV SOLN
INTRAVENOUS | Status: AC
  Filled 2024-02-08: qty 1

## 2024-02-08 MED ORDER — MIDAZOLAM HCL 5 MG/5ML IJ SOLN
INTRAMUSCULAR | Status: DC | PRN
  Administered 2024-02-08: 2 mg via INTRAVENOUS

## 2024-02-08 MED ORDER — FENTANYL CITRATE (PF) 100 MCG/2ML IJ SOLN
INTRAMUSCULAR | Status: DC | PRN
  Administered 2024-02-08 (×2): 50 ug via INTRAVENOUS
  Administered 2024-02-08: 100 ug via INTRAVENOUS

## 2024-02-08 MED ORDER — ONDANSETRON HCL 4 MG/2ML IJ SOLN
INTRAMUSCULAR | Status: DC | PRN
  Administered 2024-02-08: 4 mg via INTRAVENOUS

## 2024-02-08 MED ORDER — BUPIVACAINE LIPOSOME 1.3 % IJ SUSP
INTRAMUSCULAR | Status: AC
  Filled 2024-02-08: qty 20

## 2024-02-08 MED ORDER — CHLORHEXIDINE GLUCONATE CLOTH 2 % EX PADS: 6.0000 | MEDICATED_PAD | Freq: Once | CUTANEOUS | Status: DC

## 2024-02-08 MED ORDER — OXYCODONE HCL 5 MG PO TABS: 5.0000 mg | ORAL_TABLET | Freq: Once | ORAL | Status: DC | PRN

## 2024-02-08 MED ORDER — LIDOCAINE 2% (20 MG/ML) 5 ML SYRINGE
INTRAMUSCULAR | Status: AC
  Filled 2024-02-08: qty 5

## 2024-02-08 MED ORDER — DIPHENHYDRAMINE HCL 50 MG/ML IJ SOLN
INTRAMUSCULAR | Status: DC | PRN
  Administered 2024-02-08: 6.25 mg via INTRAVENOUS

## 2024-02-08 MED ORDER — EPHEDRINE 5 MG/ML INJ
INTRAVENOUS | Status: AC
  Filled 2024-02-08: qty 5

## 2024-02-08 MED ORDER — OXYCODONE HCL 5 MG/5ML PO SOLN: 5.0000 mg | Freq: Once | ORAL | Status: DC | PRN

## 2024-02-08 MED ORDER — HYDROMORPHONE HCL 1 MG/ML IJ SOLN
INTRAMUSCULAR | Status: AC
  Filled 2024-02-08: qty 0.5

## 2024-02-08 MED ORDER — PHENYLEPHRINE HCL (PRESSORS) 10 MG/ML IV SOLN
INTRAVENOUS | Status: DC | PRN
  Administered 2024-02-08 (×2): 80 ug via INTRAVENOUS

## 2024-02-08 MED ORDER — 0.9 % SODIUM CHLORIDE (POUR BTL) OPTIME
TOPICAL | Status: DC | PRN
  Administered 2024-02-08 (×2): 1000 mL

## 2024-02-08 MED ORDER — DROPERIDOL 2.5 MG/ML IJ SOLN: 0.6250 mg | Freq: Once | INTRAMUSCULAR | Status: DC | PRN

## 2024-02-08 MED ORDER — EPHEDRINE SULFATE (PRESSORS) 25 MG/5ML IV SOSY
PREFILLED_SYRINGE | INTRAVENOUS | Status: DC | PRN
  Administered 2024-02-08: 10 mg via INTRAVENOUS

## 2024-02-08 MED ORDER — DEXMEDETOMIDINE HCL IN NACL 80 MCG/20ML IV SOLN
INTRAVENOUS | Status: AC
Start: 2024-02-08 — End: 2024-02-08
  Filled 2024-02-08: qty 20

## 2024-02-08 SURGICAL SUPPLY — 54 items
BINDER ABDOMINAL 12 ML 46-62 (SOFTGOODS) IMPLANT
BINDER ABDOMINAL 12 SM 30-45 (SOFTGOODS) IMPLANT
BIOPATCH RED 1 DISK 7.0 (GAUZE/BANDAGES/DRESSINGS) ×4 IMPLANT
BLADE CLIPPER SURG (BLADE) IMPLANT
BLADE SURG 10 STRL SS (BLADE) ×8 IMPLANT
BLADE SURG 11 STRL SS (BLADE) IMPLANT
BLADE SURG 15 STRL LF DISP TIS (BLADE) ×2 IMPLANT
CLIP APPLIE 9.375 MED OPEN (MISCELLANEOUS) IMPLANT
DERMABOND ADVANCED .7 DNX12 (GAUZE/BANDAGES/DRESSINGS) ×4 IMPLANT
DRAIN CHANNEL 19F RND (DRAIN) ×4 IMPLANT
DRAPE UTILITY XL STRL (DRAPES) ×2 IMPLANT
DRSG TEGADERM 4X4.75 (GAUZE/BANDAGES/DRESSINGS) ×4 IMPLANT
ELECTRODE BLDE 4.0 EZ CLN MEGD (MISCELLANEOUS) ×2 IMPLANT
ELECTRODE REM PT RTRN 9FT ADLT (ELECTROSURGICAL) ×4 IMPLANT
EVACUATOR SILICONE 100CC (DRAIN) ×4 IMPLANT
GAUZE PAD ABD 8X10 STRL (GAUZE/BANDAGES/DRESSINGS) ×4 IMPLANT
GAUZE SPONGE 2X2 STRL 8-PLY (GAUZE/BANDAGES/DRESSINGS) ×4 IMPLANT
GLOVE BIO SURGEON STRL SZ 6.5 (GLOVE) IMPLANT
GLOVE BIO SURGEON STRL SZ7.5 (GLOVE) IMPLANT
GLOVE BIO SURGEON STRL SZ8 (GLOVE) ×2 IMPLANT
GLOVE BIOGEL PI IND STRL 7.0 (GLOVE) IMPLANT
GLOVE BIOGEL PI IND STRL 8 (GLOVE) ×2 IMPLANT
GOWN STRL REUS W/ TWL LRG LVL3 (GOWN DISPOSABLE) ×2 IMPLANT
GOWN STRL REUS W/TWL XL LVL3 (GOWN DISPOSABLE) ×2 IMPLANT
HEMOSTAT ARISTA ABSORB 3G PWDR (HEMOSTASIS) IMPLANT
HIBICLENS CHG 4% 4OZ BTL (MISCELLANEOUS) ×2 IMPLANT
MANIFOLD NEPTUNE II (INSTRUMENTS) ×2 IMPLANT
NDL HYPO 22X1.5 SAFETY MO (MISCELLANEOUS) ×4 IMPLANT
NEEDLE HYPO 22X1.5 SAFETY MO (MISCELLANEOUS) ×2 IMPLANT
PACK BASIN DAY SURGERY FS (CUSTOM PROCEDURE TRAY) ×2 IMPLANT
PACK UNIVERSAL I (CUSTOM PROCEDURE TRAY) ×2 IMPLANT
PENCIL SMOKE EVACUATOR (MISCELLANEOUS) ×4 IMPLANT
PIN SAFETY STERILE (MISCELLANEOUS) ×2 IMPLANT
SLEEVE SCD COMPRESS KNEE MED (STOCKING) ×2 IMPLANT
SOLN 0.9% NACL POUR BTL 1000ML (IV SOLUTION) ×2 IMPLANT
SPONGE T-LAP 18X18 ~~LOC~~+RFID (SPONGE) ×4 IMPLANT
STAPLER INSORB 30 2030 C-SECTI (MISCELLANEOUS) IMPLANT
STAPLER SKIN PROX WIDE 3.9 (STAPLE) ×2 IMPLANT
SUT MNCRL AB 3-0 PS2 27 (SUTURE) ×8 IMPLANT
SUT MNCRL AB 4-0 PS2 18 (SUTURE) ×4 IMPLANT
SUT PDS AB 2-0 CT1 27 (SUTURE) ×4 IMPLANT
SUT PROLENE 0 CT 2 (SUTURE) IMPLANT
SUT SILK 2 0 SH (SUTURE) ×4 IMPLANT
SUT VIC AB 3-0 PS1 18XBRD (SUTURE) IMPLANT
SUT VIC AB 3-0 SH 27X BRD (SUTURE) ×2 IMPLANT
SUT VICRYL AB 3 0 TIES (SUTURE) IMPLANT
SYR 20ML LL LF (SYRINGE) ×4 IMPLANT
SYR BULB IRRIG 60ML STRL (SYRINGE) IMPLANT
SYR CONTROL 10ML LL (SYRINGE) ×2 IMPLANT
TOWEL GREEN STERILE FF (TOWEL DISPOSABLE) ×4 IMPLANT
TRAY DSU PREP LF (CUSTOM PROCEDURE TRAY) ×2 IMPLANT
TUBE CONNECTING 20X1/4 (TUBING) ×2 IMPLANT
UNDERPAD 30X36 HEAVY ABSORB (UNDERPADS AND DIAPERS) ×4 IMPLANT
YANKAUER SUCT BULB TIP NO VENT (SUCTIONS) ×2 IMPLANT

## 2024-02-08 NOTE — Anesthesia Preprocedure Evaluation (Signed)
 Anesthesia Evaluation  Patient identified by MRN, date of birth, ID band Patient awake    Reviewed: Allergy & Precautions, NPO status , Patient's Chart, lab work & pertinent test results  History of Anesthesia Complications Negative for: history of anesthetic complications  Airway Mallampati: II  TM Distance: >3 FB Neck ROM: Full    Dental no notable dental hx. (+) Teeth Intact   Pulmonary neg pulmonary ROS, neg sleep apnea, neg COPD, Patient abstained from smoking.Not current smoker   Pulmonary exam normal breath sounds clear to auscultation       Cardiovascular Exercise Tolerance: Good METShypertension, Pt. on medications (-) CAD and (-) Past MI (-) dysrhythmias  Rhythm:Regular Rate:Normal - Systolic murmurs    Neuro/Psych  PSYCHIATRIC DISORDERS Anxiety     negative neurological ROS     GI/Hepatic ,neg GERD  ,,(+)     (-) substance abuse    Endo/Other  neg diabetes    Renal/GU negative Renal ROS     Musculoskeletal   Abdominal  (+) + obese  Peds  Hematology   Anesthesia Other Findings Past Medical History: No date: Anxiety No date: Heart murmur     Comment:  as child No date: HTN (hypertension)     Comment:  Stopped Aldomet  w/ positive UPT then restarted  Oct.               2013 2012: Hypertension     Comment:  WAS TAKING ALDOMET  10/04/2011: Obese     Comment:  pregravid BMI=48 No date: Urinary tract infection  Reproductive/Obstetrics                              Anesthesia Physical Anesthesia Plan  ASA: 2  Anesthesia Plan: General   Post-op Pain Management: Toradol  IV (intra-op)* and Ofirmev  IV (intra-op)*   Induction: Intravenous  PONV Risk Score and Plan: 3 and Ondansetron , Dexamethasone  and Midazolam   Airway Management Planned: LMA  Additional Equipment: None  Intra-op Plan:   Post-operative Plan: Extubation in OR  Informed Consent: I have reviewed the  patients History and Physical, chart, labs and discussed the procedure including the risks, benefits and alternatives for the proposed anesthesia with the patient or authorized representative who has indicated his/her understanding and acceptance.     Dental advisory given  Plan Discussed with: CRNA and Surgeon  Anesthesia Plan Comments: (Discussed risks of anesthesia with patient, including PONV, sore throat, lip/dental/eye damage. Rare risks discussed as well, such as cardiorespiratory and neurological sequelae, and allergic reactions. Discussed the role of CRNA in patient's perioperative care. Patient understands.)         Anesthesia Quick Evaluation

## 2024-02-08 NOTE — Discharge Instructions (Addendum)
 No tylenol  before 3:30 pm.  Activity As tolerated: NO showers until 3 days after surgery. Keep binder on 24/7 unless showering or changing dressings. This is important to prevent additional swelling.  NO driving while in pain, taking pain medication or if you are unable to safely react to traffic. No heavy activities. No lifting > 15 pounds. No abdominal or core exercise until incision is well-healed.  Recommend that you sleep with a slight flex at your hips. You can place a pillow underneath your knees as well as behind your back to take pressure off of the incision.  Please take Tylenol  500 mg every 6 hours for pain control. If you can take ibuprofen  (NSAIDs), please take them additionally, as per the instructions on the bottle.  Take the oxycodone  only as needed for severe pain.   Diet: Regular. Drink plenty of fluids (water, avoid juice/soda) and eat healthy, high protein, low carbs.  Wound Care: Keep dressing clean & dry. You may change bandages after showering if you continue to notice some drainage.  Special Instructions: Call Doctor if any unusual problems occur such as pain, excessive Bleeding, unrelieved Nausea/vomiting, Fever &/or chills  Drains: Measure drain output from drains every 24 hours. Record this on a log for us  to view during post-op appointments. Drainage will change colors over the next week to two weeks. This is normal. Color of drainage can vary from red-pink-orange-yellow.  Follow-up appointment: As scheduled.   Post Anesthesia Home Care Instructions  Activity: Get plenty of rest for the remainder of the day. A responsible individual must stay with you for 24 hours following the procedure.  For the next 24 hours, DO NOT: -Drive a car -Advertising copywriter -Drink alcoholic beverages -Take any medication unless instructed by your physician -Make any legal decisions or sign important papers.  Meals: Start with liquid foods such as gelatin or soup. Progress to  regular foods as tolerated. Avoid greasy, spicy, heavy foods. If nausea and/or vomiting occur, drink only clear liquids until the nausea and/or vomiting subsides. Call your physician if vomiting continues.  Special Instructions/Symptoms: Your throat may feel dry or sore from the anesthesia or the breathing tube placed in your throat during surgery. If this causes discomfort, gargle with warm salt water. The discomfort should disappear within 24 hours.  If you had a scopolamine  patch placed behind your ear for the management of post- operative nausea and/or vomiting:  1. The medication in the patch is effective for 72 hours, after which it should be removed.  Wrap patch in a tissue and discard in the trash. Wash hands thoroughly with soap and water. 2. You may remove the patch earlier than 72 hours if you experience unpleasant side effects which may include dry mouth, dizziness or visual disturbances. 3. Avoid touching the patch. Wash your hands with soap and water after contact with the patch.

## 2024-02-08 NOTE — Op Note (Signed)
 DATE OF OPERATION: 02/08/2024  LOCATION: [facility] operating Room  PREOPERATIVE DIAGNOSIS: Panniculitis  POSTOPERATIVE DIAGNOSIS: Same  PROCEDURE: Panniculectomy  SURGEON: Marinell Birmingham, MD  ASSISTANT: Honora Seip  EBL: 100 cc  CONDITION: Stable  COMPLICATIONS: None  INDICATION: The patient, Dawn Dawson, is a 38 y.o. female born on 1985-09-24, is here for treatment ongoing rashes on the posterior aspect of the pannus and in the intertriginous regions.Dawn Dawson   PROCEDURE DETAILS:  The patient was seen prior to surgery and marked.  The IV antibiotics were given. The patient was taken to the operating room and given a general anesthetic. A standard time out was performed and all information was confirmed by those in the room. SCDs were placed.   The abdomen is prepped and draped in the usual sterile manner.  A transverse incision was made across the lower portion of the abdomen sharply and the electrocautery was used to dissected subcutaneous tissues down to the anterior abdominal wall fascia.  The skin and fat were then elevated from the fascia in a caudad cranial manner to the level of the umbilicus.  Prior to the procedure I discussed resection of the umbilicus with Dawn Dawson and she requested that I do this so that I could remove more of the excess skin.  The umbilical stalk was identified and divided sharply.  The stalk was closed with a 2-0 Prolene and the fascia imbricated over the stalk with interrupted 2-0 Prolene sutures.  I then continued the elevation to just above the umbilicus.  An appropriate point for resection of the pannus was identified and incised sharply.  The pannus was removed.  The surgical bed was inspected for bleeding and meticulous hemostasis was achieved with the electrocautery.  The subfascial space and subcutaneous tissues were infiltrated with 100 mL of a mixture of Exparel , plain Marcaine , and saline.  The surgical bed was irrigated with warm normal saline.   2 TXA's soaked sponges were placed on the surgical surfaces and allowed to dwell for 5 minutes.  Once these were removed a Valsalva was held for 8 seconds and no bleeding was appreciated.  219 French round drains were placed in the surgical bed and brought out through separate stab incisions.  The skin edges were then tailor tacked in place with skin clips and Scarpa's fascia approximated with interrupted 2-0 PDS sutures.  The dermis was closed with interrupted and running 3-0 Monocryl sutures and the skin was closed with a running 4-0 Monocryl subcuticular stitch.  The incision was sealed with Dermabond.  All instrument needle and sponge counts were reported as correct and there were no complications appreciated during the procedure.  The patient was awakened from anesthesia and transferred to the recovery room in good condition. The patient was allowed to wake up and taken to recovery room in stable condition at the end of the case. The family was notified at the end of the case.   The advanced practice practitioner (APP) assisted throughout the case.  The APP was essential in retraction and counter traction when needed to make the case progress smoothly.  This retraction and assistance made it possible to see the tissue plans for the procedure.  The assistance was needed for blood control, tissue re-approximation and assisted with closure of the incision site.

## 2024-02-08 NOTE — Transfer of Care (Signed)
 Immediate Anesthesia Transfer of Care Note  Patient: Dawn Dawson  Procedure(s) Performed: PANNICULECTOMY (Abdomen)  Patient Location: PACU  Anesthesia Type:General  Level of Consciousness: awake, alert , oriented, drowsy, and patient cooperative  Airway & Oxygen Therapy: Patient Spontanous Breathing and Patient connected to face mask oxygen  Post-op Assessment: Report given to RN and Post -op Vital signs reviewed and stable  Post vital signs: Reviewed and stable  Last Vitals:  Vitals Value Taken Time  BP 127/81 02/08/24 10:32  Temp    Pulse 57 02/08/24 10:33  Resp 20 02/08/24 10:33  SpO2 100 % 02/08/24 10:33  Vitals shown include unfiled device data.  Last Pain:  Vitals:   02/08/24 0628  TempSrc: Temporal  PainSc: 0-No pain      Patients Stated Pain Goal: 3 (02/08/24 9371)  Complications: No notable events documented.

## 2024-02-08 NOTE — Anesthesia Procedure Notes (Signed)
 Procedure Name: LMA Insertion Date/Time: 02/08/2024 7:43 AM  Performed by: Emilio Rock BIRCH, CRNAPre-anesthesia Checklist: Patient identified, Emergency Drugs available, Suction available and Patient being monitored Patient Re-evaluated:Patient Re-evaluated prior to induction Oxygen Delivery Method: Circle System Utilized Preoxygenation: Pre-oxygenation with 100% oxygen Induction Type: IV induction Ventilation: Mask ventilation without difficulty LMA: LMA inserted LMA Size: 4.0 Number of attempts: 1 Airway Equipment and Method: bite block Placement Confirmation: positive ETCO2 Tube secured with: Tape Dental Injury: Teeth and Oropharynx as per pre-operative assessment

## 2024-02-08 NOTE — Anesthesia Postprocedure Evaluation (Signed)
 Anesthesia Post Note  Patient: Dawn Dawson  Procedure(s) Performed: PANNICULECTOMY (Abdomen)     Patient location during evaluation: PACU Anesthesia Type: General Level of consciousness: awake and alert Pain management: pain level controlled Vital Signs Assessment: post-procedure vital signs reviewed and stable Respiratory status: spontaneous breathing, nonlabored ventilation, respiratory function stable and patient connected to nasal cannula oxygen Cardiovascular status: blood pressure returned to baseline and stable Postop Assessment: no apparent nausea or vomiting Anesthetic complications: no   No notable events documented.  Last Vitals:  Vitals:   02/08/24 1100 02/08/24 1115  BP: (!) 156/87 127/76  Pulse: 62 (!) 52  Resp: 18 (!) 8  Temp:    SpO2: 100% 100%    Last Pain:  Vitals:   02/08/24 1100  TempSrc:   PainSc: 8                  [name]

## 2024-02-08 NOTE — Interval H&P Note (Signed)
 History and Physical Interval Note: No change in exam or indication for surgery She would like to include resection of the umbilicus All questions answered Will proceed at her request  02/08/2024 7:13 AM  Dawn Dawson  has presented today for surgery, with the diagnosis of m79.3.  The various methods of treatment have been discussed with the patient and family. After consideration of risks, benefits and other options for treatment, the patient has consented to  Procedure(s): PANNICULECTOMY (N/A) as a surgical intervention.  The patient's history has been reviewed, patient examined, no change in status, stable for surgery.  I have reviewed the patient's chart and labs.  Questions were answered to the patient's satisfaction.     Dawn Dawson

## 2024-02-09 ENCOUNTER — Encounter (HOSPITAL_BASED_OUTPATIENT_CLINIC_OR_DEPARTMENT_OTHER): Payer: Self-pay | Admitting: Plastic Surgery

## 2024-02-10 ENCOUNTER — Ambulatory Visit: Admitting: Student

## 2024-02-10 ENCOUNTER — Encounter: Payer: Self-pay | Admitting: Student

## 2024-02-10 VITALS — BP 98/69 | HR 68

## 2024-02-10 DIAGNOSIS — M793 Panniculitis, unspecified: Secondary | ICD-10-CM

## 2024-02-10 NOTE — Progress Notes (Addendum)
 Patient is a 38 year old female who recently underwent panniculectomy with Dr. Waddell on 02/08/2024.  Patient is 2 days postop.  She presents to the clinic today with concerns about bleeding around her drain sites.  Today, patient presents with her husband at bedside.  She reports that she has been experiencing bleeding around her right drain site.  She states that she went to an emergency room in [place].  She states that they did not do any imaging, but did give her some pain medications.  She states that the pain medications made her feel a little bit out of it.  She states though that she is not experiencing any lightheadedness, chest pain or shortness of breath.  She reports that her drain output since surgery has been about 15 to 20 cc every few hours, but since yesterday it has increased to about 30 to 40 cc every few hours.  She reports that she has been ambulating without any difficulty.  She reports she has been eating and drinking without any difficulty.  She does not report any nausea or vomiting.  She does report she has some burning at her incision site, but otherwise reports her pain has been controlled with Tylenol  and oxycodone .  Vitals:   02/10/24 0911  BP: 98/69  Pulse: 68  SpO2: 99%  Blood pressure mildly soft, pulse within normal limits.  Chaperone present on exam.  On exam, patient is sitting upright in no acute distress.  Abdomen is soft, there is some mild tenderness to palpation.  There is no overlying erythema, no overlying ecchymosis.  There is no obvious fluid collection noted on exam.  Incision is clean dry and intact.  Left JP drain is in place and functioning with minimal serosanguineous drainage in the bulb.  To the right JP drain site, there is a little bit of dark bloody oozing noted.  The JP drain itself is functioning and in place.  There is about 25 cc of serosanguineous/bloody drainage in the bulb.  Pressure was held at the right JP drain site for several minutes  and oozing stopped.  Dr. Waddell had the opportunity to evaluate the patient.  It was discussed with the patient that there is a possibility she could be bleeding in the surgical space. He discussed the possibility the having to go back to the OR.  It was recommended that patient apply compression and stay in our clinic for observation for any signs of bleeding.  It was discussed with the patient to not eat anything at this time and discussed with her she may take a few sips of water if necessary.  Patient expressed understanding.  ADDEND: Patient was observed in the clinic for several hours.  Throughout her observation, patient felt well.  Dr. Waddell reevaluated her several times.  Her drain output was fairly minimal, may be about 20 cc of serosanguineous/minimal bloody drainage from her right drain.  Dr. Waddell did place another stitch near the drain to help secure it more.  And wrapped with compression dressing over the drain site.  Oozing around the drain site continued.  Abdomen remains soft and there was no developing ecchymosis throughout the day or signs of hematoma in the surgical space.  It was recommended that patient keep the compression dressing on over the drain site and have a low threshold for calling if she experiences any changes.  Plan is for patient to follow back up early next week otherwise.

## 2024-02-11 ENCOUNTER — Other Ambulatory Visit: Payer: Self-pay | Admitting: Plastic Surgery

## 2024-02-11 DIAGNOSIS — Z9889 Other specified postprocedural states: Secondary | ICD-10-CM

## 2024-02-11 MED ORDER — OXYCODONE-ACETAMINOPHEN 10-325 MG PO TABS
1.0000 | ORAL_TABLET | Freq: Three times a day (TID) | ORAL | 0 refills | Status: AC | PRN
Start: 2024-02-11 — End: 2024-02-14

## 2024-02-11 NOTE — Progress Notes (Signed)
 Called Dawn Dawson today.  She is doing much better with virtually no bleeding from her drain site.  She is up and moving around though she is still having some discomfort.  She did ask if she could have a refill of the small amount of her narcotic pain medicine.  Given her history over the past 24 hours I do not think this is unreasonable we will call her in an additional 10 pain pills.  She was encouraged to keep her scheduled follow-up appointment next week.

## 2024-02-14 ENCOUNTER — Encounter: Admitting: Plastic Surgery

## 2024-02-16 ENCOUNTER — Encounter: Payer: Self-pay | Admitting: Plastic Surgery

## 2024-02-16 ENCOUNTER — Ambulatory Visit (INDEPENDENT_AMBULATORY_CARE_PROVIDER_SITE_OTHER): Admitting: Plastic Surgery

## 2024-02-16 VITALS — BP 119/79 | HR 73 | Temp 98.4°F | Ht 63.0 in | Wt 201.0 lb

## 2024-02-16 DIAGNOSIS — Z9889 Other specified postprocedural states: Secondary | ICD-10-CM

## 2024-02-16 NOTE — Progress Notes (Signed)
 Ms. Tabor returns today 8 days postop from a panniculectomy.  She is doing well and has not taken any narcotic pain medicine for the past 5 days.  Drain output remains right at approximately 35 mL/day per drain.  Given the fact that she had some bleeding postoperatively I have elected to leave her drains until they are clearly less than 30 mL/day.  She understands we will only take 1 drain out at a time.  The incisions overall look very good with no areas of separation.  She has minimal pain with manipulation of the incisions.  I have encouraged increased activity.  She will continue to wear her binder.  Keep scheduled follow-up appointment however she may return as soon as her drains are clearly 30 mL/day or less for 2 days in a row.

## 2024-02-18 ENCOUNTER — Ambulatory Visit (INDEPENDENT_AMBULATORY_CARE_PROVIDER_SITE_OTHER): Admitting: Physician Assistant

## 2024-02-18 ENCOUNTER — Telehealth: Payer: Self-pay | Admitting: Physician Assistant

## 2024-02-18 DIAGNOSIS — Z9889 Other specified postprocedural states: Secondary | ICD-10-CM

## 2024-02-18 NOTE — Telephone Encounter (Signed)
 Pt wants to come in today to have her drain removed she said that Dr Waddell told her last week she could just come in and someone would see her, please advise

## 2024-02-18 NOTE — Progress Notes (Signed)
 Patient is a 38 year old female s/p panniculectomy performed 02/08/2024 by Dr. Waddell who presents to clinic for postoperative follow-up and consideration of drain removal.  She was just seen here in clinic 02/16/2024.  Drains were approximately 35 cc per side daily output and decision was made to hold off on removing either one, but that she could return later in the week for removal if less than 30 cc x 2 days.  Today, she is doing well.  Drain output on the right is a bit elevated, 40 cc/day.  However, on the left side it has been 15 cc/day for the past couple of days.  Terence Waddell, she is hoping to be removed today.  On exam, abdomen is soft and nondistended.  Drains are intact and functioning with sanguinous output in bulbs.  Incision CDI throughout.  Left drain removed without complication or difficulty, well-tolerated by patient.  Emphasized the importance of continued activity modifications compressive garments.  Will hold off on removal of final drain for at least a week.

## 2024-02-18 NOTE — Telephone Encounter (Signed)
 OK, I'd work her in for 1030/1045, but she lives in [place], [place] and I don't have any more morning availability. We're only working 1/2 day today.  Unless somebody else is seeing her she will need to come back early next week.

## 2024-02-28 ENCOUNTER — Encounter: Payer: Self-pay | Admitting: Plastic Surgery

## 2024-02-28 ENCOUNTER — Encounter: Payer: Self-pay | Admitting: Physician Assistant

## 2024-02-28 ENCOUNTER — Ambulatory Visit: Admitting: Physician Assistant

## 2024-02-28 VITALS — BP 112/75 | HR 80 | Ht 63.0 in | Wt 204.4 lb

## 2024-02-28 DIAGNOSIS — Z9889 Other specified postprocedural states: Secondary | ICD-10-CM

## 2024-02-28 NOTE — Progress Notes (Signed)
 Patient is a 38 year old female s/p panniculectomy performed 02/08/2024 by Dr. Waddell who presents to clinic for postoperative follow-up.  She was last seen here in clinic on 02/18/2024.  At that time, left drain was removed without complication.  Plan was for holding off on removal of final drain for at least a week.  Today, she is companied by spouse at bedside.  She states that drain output has been negligible, less than 30 cc consistently for the past 4 days.  She states that she has had significant discomfort at the drain tube insertion site and is eagerly hopeful for removal today.  She denies any other abdominal discomfort, fevers, or other concerns.  On exam, abdomen is soft and nondistended.  No TTP.  The right drain is intact and functional with dark sanguinous output in bulb.  She does have some evidence of recent bleeding from within the drain tube insertion site, as well.  Discussed the risks of removal here today.  She is understanding and willing to proceed.  Drains removed without complication or difficulty, well-tolerated by patient.  Hopefully with the removal of the tube, there will be less irritation from within the drain tract which is possibly contributing to her ongoing light bleeding.  Makeshift gauze bolster dressing applied.  Follow-up as scheduled, sooner if needed.  Emphasized the importance of continued compression in the interim.  Picture(s) obtained of the patient and placed in the chart were with the patient's or guardian's permission.

## 2024-03-06 ENCOUNTER — Ambulatory Visit: Admitting: Plastic Surgery

## 2024-03-06 VITALS — BP 126/80 | HR 100

## 2024-03-06 DIAGNOSIS — L03311 Cellulitis of abdominal wall: Secondary | ICD-10-CM

## 2024-03-06 DIAGNOSIS — Z9889 Other specified postprocedural states: Secondary | ICD-10-CM

## 2024-03-06 MED ORDER — SULFAMETHOXAZOLE-TRIMETHOPRIM 800-160 MG PO TABS: 1.0000 | ORAL_TABLET | Freq: Two times a day (BID) | ORAL | 0 refills | Status: AC

## 2024-03-06 MED ORDER — METRONIDAZOLE 500 MG PO TABS: 500.0000 mg | ORAL_TABLET | Freq: Three times a day (TID) | ORAL | 0 refills | Status: AC

## 2024-03-06 NOTE — Progress Notes (Signed)
 Dawn Dawson returns today approximately 4 weeks postop from a panniculectomy.  Her panniculectomy was initially complicated by drain site and bleeding which required repositioning and resuturing of the drain.  Since that initial evaluation she has been seen in the office several times and has had both drains removed.  Over the weekend she noted drainage from the right side where the bleeding had occurred.  She also noted increased pain along the incision on the left side.  She denies fevers or chills.  On evaluation I believe that she had a retained hematoma which is drained from the old drain site.  There is no pain no tenderness no erythema on the right side.  There is a small amount of granulation tissue in the wound.  On the left side there is an area approximately 5 x 10 cm of indurated tissue that is tender to palpation.  There is minimal erythema and no drainage.  She does have some separation of the incision just below this.  Drainage on right: I believe this is a hematoma which is drained and requires nothing other than dressings.  Induration on the left: She appears to have a localized cellulitis.  I will treat this with oral antibiotics.  She will return on Thursday or Friday for follow-up to ensure that she is improving.  She understands that if she worsens that she will need to be admitted for IV antibiotics.

## 2024-03-09 ENCOUNTER — Ambulatory Visit: Admitting: Plastic Surgery

## 2024-03-09 VITALS — BP 126/79 | HR 92 | Temp 97.9°F | Ht 63.0 in | Wt 206.0 lb

## 2024-03-09 DIAGNOSIS — L03311 Cellulitis of abdominal wall: Secondary | ICD-10-CM

## 2024-03-09 DIAGNOSIS — Z9889 Other specified postprocedural states: Secondary | ICD-10-CM

## 2024-03-09 MED ORDER — ONDANSETRON 4 MG PO TBDP: 4.0000 mg | ORAL_TABLET | Freq: Three times a day (TID) | ORAL | 0 refills | Status: AC | PRN

## 2024-03-09 NOTE — Progress Notes (Signed)
 Ms. Dawn Dawson returns today for evaluation.  She still notes that she is having some drainage from the right drain site but feels that has become more serosanguineous.  The area of cellulitis on the left side of her incision is improving with the antibiotics.  She states she overall feels well.  She does note some ongoing nausea with the antibiotic use though  On examination there is drainage from the right drain site.  There is no erythema no induration around the drain site on the left the induration is markedly improved with no tenderness to palpation.  The area of incisional separation is healing nicely.  Status post panniculectomy: Patient has what is most likely an old hematoma draining from the right drain site.  We will continue to watch this as it hopefully decreases.  The cellulitis on the left side is improving with antibiotics.  Will prescribe additional Zofran  for the nausea.  She will follow-up next week prior to the Christmas break.  She will let me know sooner if she has any issues.

## 2024-03-13 ENCOUNTER — Ambulatory Visit: Admitting: Plastic Surgery

## 2024-03-13 ENCOUNTER — Other Ambulatory Visit: Payer: Self-pay

## 2024-03-13 ENCOUNTER — Encounter (HOSPITAL_BASED_OUTPATIENT_CLINIC_OR_DEPARTMENT_OTHER): Payer: Self-pay | Admitting: Plastic Surgery

## 2024-03-13 ENCOUNTER — Encounter: Payer: Self-pay | Admitting: Plastic Surgery

## 2024-03-13 VITALS — BP 117/81 | HR 80 | Ht 63.0 in | Wt 207.8 lb

## 2024-03-13 DIAGNOSIS — T8149XA Infection following a procedure, other surgical site, initial encounter: Secondary | ICD-10-CM

## 2024-03-13 DIAGNOSIS — Z9889 Other specified postprocedural states: Secondary | ICD-10-CM

## 2024-03-13 NOTE — H&P (View-Only) (Signed)
 Dawn Dawson returns today for evaluation.  She underwent panniculectomy approximately 4 weeks ago.  This has been complicated by postoperative bleeding, cellulitis and persistent drainage from the right drain site.  Initially I believe that this drainage was from a retained hematoma which had liquefied however she is still having significant drainage and it is now coming from the midportion of the incision as well.  I believe that she probably has an infected retained seroma.  I have discussed this with her and her husband.  On examination there are multiple small areas of granulation tissue and wound separation.  The right drain site on the thigh has granulation tissue and persistent thin purulent appearing drainage.  There is minimal to no tenderness along the incision or on the abdominal wall.  There is no erythema.  Status post panniculectomy: Patient is having persistent drainage from the right drain site.  Given the fact that this has not improved I believe that she would be best served with return to the OR opening the incision and washing her out and replacing the drain.  She is in agreement.  She understands that there may be additional bleeding and she may require longer-term antibiotics.  All questions were answered.  Will schedule her for washout of the panniculectomy site with replacement of the drain tomorrow if possible.

## 2024-03-13 NOTE — Progress Notes (Signed)
 Dawn Dawson returns today for evaluation.  She underwent panniculectomy approximately 4 weeks ago.  This has been complicated by postoperative bleeding, cellulitis and persistent drainage from the right drain site.  Initially I believe that this drainage was from a retained hematoma which had liquefied however she is still having significant drainage and it is now coming from the midportion of the incision as well.  I believe that she probably has an infected retained seroma.  I have discussed this with her and her husband.  On examination there are multiple small areas of granulation tissue and wound separation.  The right drain site on the thigh has granulation tissue and persistent thin purulent appearing drainage.  There is minimal to no tenderness along the incision or on the abdominal wall.  There is no erythema.  Status post panniculectomy: Patient is having persistent drainage from the right drain site.  Given the fact that this has not improved I believe that she would be best served with return to the OR opening the incision and washing her out and replacing the drain.  She is in agreement.  She understands that there may be additional bleeding and she may require longer-term antibiotics.  All questions were answered.  Will schedule her for washout of the panniculectomy site with replacement of the drain tomorrow if possible.

## 2024-03-14 ENCOUNTER — Encounter (HOSPITAL_BASED_OUTPATIENT_CLINIC_OR_DEPARTMENT_OTHER): Payer: Self-pay | Admitting: Plastic Surgery

## 2024-03-14 ENCOUNTER — Encounter (HOSPITAL_BASED_OUTPATIENT_CLINIC_OR_DEPARTMENT_OTHER): Admission: RE | Disposition: A | Payer: Self-pay | Source: Home / Self Care | Attending: Plastic Surgery

## 2024-03-14 ENCOUNTER — Ambulatory Visit (HOSPITAL_BASED_OUTPATIENT_CLINIC_OR_DEPARTMENT_OTHER)
Admission: RE | Admit: 2024-03-14 | Discharge: 2024-03-14 | Disposition: A | Discharge status: Home / Self Care | Attending: Plastic Surgery | Admitting: Plastic Surgery

## 2024-03-14 ENCOUNTER — Ambulatory Visit (HOSPITAL_BASED_OUTPATIENT_CLINIC_OR_DEPARTMENT_OTHER): Admitting: Certified Registered"

## 2024-03-14 ENCOUNTER — Other Ambulatory Visit: Payer: Self-pay

## 2024-03-14 DIAGNOSIS — Z01818 Encounter for other preprocedural examination: Secondary | ICD-10-CM

## 2024-03-14 DIAGNOSIS — L7634 Postprocedural seroma of skin and subcutaneous tissue following other procedure: Secondary | ICD-10-CM | POA: Insufficient documentation

## 2024-03-14 DIAGNOSIS — Y834 Other reconstructive surgery as the cause of abnormal reaction of the patient, or of later complication, without mention of misadventure at the time of the procedure: Secondary | ICD-10-CM | POA: Insufficient documentation

## 2024-03-14 DIAGNOSIS — I10 Essential (primary) hypertension: Secondary | ICD-10-CM | POA: Insufficient documentation

## 2024-03-14 DIAGNOSIS — T8149XA Infection following a procedure, other surgical site, initial encounter: Secondary | ICD-10-CM | POA: Insufficient documentation

## 2024-03-14 HISTORY — PX: DEBRIDEMENT AND CLOSURE WOUND: SHX5614

## 2024-03-14 LAB — POCT PREGNANCY, URINE: Preg Test, Ur: NEGATIVE

## 2024-03-14 SURGERY — DEBRIDEMENT, WOUND, WITH CLOSURE
Anesthesia: General | Site: Abdomen

## 2024-03-14 MED ORDER — TRANEXAMIC ACID 1000 MG/10ML IV SOLN
INTRAVENOUS | Status: DC | PRN
  Administered 2024-03-14: 3000 mg via TOPICAL

## 2024-03-14 MED ORDER — CHLORHEXIDINE GLUCONATE CLOTH 2 % EX PADS: 6.0000 | MEDICATED_PAD | Freq: Once | CUTANEOUS | Status: DC

## 2024-03-14 MED ORDER — FENTANYL CITRATE (PF) 100 MCG/2ML IJ SOLN
INTRAMUSCULAR | Status: DC | PRN
  Administered 2024-03-14 (×4): 50 ug via INTRAVENOUS

## 2024-03-14 MED ORDER — HEMOSTATIC AGENTS (NO CHARGE) OPTIME
TOPICAL | Status: DC | PRN
  Administered 2024-03-14: 1 via TOPICAL

## 2024-03-14 MED ORDER — HYDROMORPHONE HCL 1 MG/ML IJ SOLN
INTRAMUSCULAR | Status: AC
  Filled 2024-03-14: qty 0.5

## 2024-03-14 MED ORDER — OXYCODONE HCL 5 MG PO TABS
5.0000 mg | ORAL_TABLET | Freq: Once | ORAL | Status: AC | PRN
  Administered 2024-03-14: 5 mg via ORAL

## 2024-03-14 MED ORDER — LACTATED RINGERS IV SOLN: INTRAVENOUS | Status: DC

## 2024-03-14 MED ORDER — BUPIVACAINE LIPOSOME 1.3 % IJ SUSP
INTRAMUSCULAR | Status: AC
  Filled 2024-03-14: qty 20

## 2024-03-14 MED ORDER — FENTANYL CITRATE (PF) 100 MCG/2ML IJ SOLN
INTRAMUSCULAR | Status: AC
  Filled 2024-03-14: qty 2

## 2024-03-14 MED ORDER — OXYCODONE HCL 5 MG PO TABS
ORAL_TABLET | ORAL | Status: AC
  Filled 2024-03-14: qty 1

## 2024-03-14 MED ORDER — ACETAMINOPHEN 10 MG/ML IV SOLN
INTRAVENOUS | Status: AC
  Filled 2024-03-14: qty 100

## 2024-03-14 MED ORDER — ACETAMINOPHEN 10 MG/ML IV SOLN
1000.0000 mg | Freq: Once | INTRAVENOUS | Status: DC | PRN
  Administered 2024-03-14: 1000 mg via INTRAVENOUS

## 2024-03-14 MED ORDER — TRANEXAMIC ACID 1000 MG/10ML IV SOLN
INTRAVENOUS | Status: AC
  Filled 2024-03-14: qty 30

## 2024-03-14 MED ORDER — ONDANSETRON HCL 4 MG/2ML IJ SOLN
INTRAMUSCULAR | Status: AC
  Filled 2024-03-14: qty 2

## 2024-03-14 MED ORDER — DEXMEDETOMIDINE HCL IN NACL 80 MCG/20ML IV SOLN
INTRAVENOUS | Status: DC | PRN
  Administered 2024-03-14: 4 ug via INTRAVENOUS

## 2024-03-14 MED ORDER — SODIUM CHLORIDE (PF) 0.9 % IJ SOLN
INTRAMUSCULAR | Status: AC
  Filled 2024-03-14: qty 50

## 2024-03-14 MED ORDER — MIDAZOLAM HCL 2 MG/2ML IJ SOLN
INTRAMUSCULAR | Status: AC
  Filled 2024-03-14: qty 2

## 2024-03-14 MED ORDER — PROPOFOL 10 MG/ML IV BOLUS
INTRAVENOUS | Status: AC
  Filled 2024-03-14: qty 20

## 2024-03-14 MED ORDER — FENTANYL CITRATE (PF) 100 MCG/2ML IJ SOLN
25.0000 ug | INTRAMUSCULAR | Status: DC | PRN
  Administered 2024-03-14: 50 ug via INTRAVENOUS

## 2024-03-14 MED ORDER — LIDOCAINE 2% (20 MG/ML) 5 ML SYRINGE
INTRAMUSCULAR | Status: DC | PRN
  Administered 2024-03-14: 80 mg via INTRAVENOUS

## 2024-03-14 MED ORDER — BUPIVACAINE HCL (PF) 0.25 % IJ SOLN
INTRAMUSCULAR | Status: AC
  Filled 2024-03-14: qty 30

## 2024-03-14 MED ORDER — ONDANSETRON HCL 4 MG/2ML IJ SOLN
INTRAMUSCULAR | Status: DC | PRN
  Administered 2024-03-14: 4 mg via INTRAVENOUS

## 2024-03-14 MED ORDER — DEXAMETHASONE SOD PHOSPHATE PF 10 MG/ML IJ SOLN
INTRAMUSCULAR | Status: DC | PRN
  Administered 2024-03-14: 10 mg via INTRAVENOUS

## 2024-03-14 MED ORDER — SODIUM CHLORIDE (PF) 0.9 % IJ SOLN
INTRAMUSCULAR | Status: DC | PRN
  Administered 2024-03-14: 100 mL

## 2024-03-14 MED ORDER — LIDOCAINE 2% (20 MG/ML) 5 ML SYRINGE
INTRAMUSCULAR | Status: AC
  Filled 2024-03-14: qty 5

## 2024-03-14 MED ORDER — DROPERIDOL 2.5 MG/ML IJ SOLN: 0.6250 mg | Freq: Once | INTRAMUSCULAR | Status: DC | PRN

## 2024-03-14 MED ORDER — 0.9 % SODIUM CHLORIDE (POUR BTL) OPTIME
TOPICAL | Status: DC | PRN
  Administered 2024-03-14 (×3): 1000 mL

## 2024-03-14 MED ORDER — CEFAZOLIN SODIUM-DEXTROSE 2-4 GM/100ML-% IV SOLN
INTRAVENOUS | Status: AC
  Filled 2024-03-14: qty 100

## 2024-03-14 MED ORDER — MIDAZOLAM HCL 5 MG/5ML IJ SOLN
INTRAMUSCULAR | Status: DC | PRN
  Administered 2024-03-14: 2 mg via INTRAVENOUS

## 2024-03-14 MED ORDER — PROPOFOL 500 MG/50ML IV EMUL
INTRAVENOUS | Status: DC | PRN
  Administered 2024-03-14: 125 ug/kg/min via INTRAVENOUS

## 2024-03-14 MED ORDER — HYDROMORPHONE HCL 1 MG/ML IJ SOLN
INTRAMUSCULAR | Status: DC | PRN
  Administered 2024-03-14: .5 mg via INTRAVENOUS

## 2024-03-14 MED ORDER — CEFAZOLIN SODIUM-DEXTROSE 2-4 GM/100ML-% IV SOLN
2.0000 g | INTRAVENOUS | Status: AC
  Administered 2024-03-14: 2 g via INTRAVENOUS

## 2024-03-14 MED ORDER — PROPOFOL 10 MG/ML IV BOLUS
INTRAVENOUS | Status: DC | PRN
  Administered 2024-03-14: 180 mg via INTRAVENOUS

## 2024-03-14 MED ORDER — OXYCODONE HCL 5 MG/5ML PO SOLN: 5.0000 mg | Freq: Once | ORAL | Status: AC | PRN

## 2024-03-14 SURGICAL SUPPLY — 54 items
BINDER ABDOMINAL 12 ML 46-62 (SOFTGOODS) IMPLANT
BINDER ABDOMINAL 12 SM 30-45 (SOFTGOODS) IMPLANT
BIOPATCH RED 1 DISK 7.0 (GAUZE/BANDAGES/DRESSINGS) ×2 IMPLANT
BLADE CLIPPER SURG (BLADE) IMPLANT
BLADE SURG 10 STRL SS (BLADE) ×4 IMPLANT
BLADE SURG 11 STRL SS (BLADE) IMPLANT
BLADE SURG 15 STRL LF DISP TIS (BLADE) ×1 IMPLANT
CLIP APPLIE 9.375 MED OPEN (MISCELLANEOUS) IMPLANT
DERMABOND ADVANCED .7 DNX12 (GAUZE/BANDAGES/DRESSINGS) ×2 IMPLANT
DRAIN CHANNEL 19F RND (DRAIN) ×2 IMPLANT
DRAPE UTILITY XL STRL (DRAPES) ×1 IMPLANT
DRSG TEGADERM 4X4.75 (GAUZE/BANDAGES/DRESSINGS) ×2 IMPLANT
ELECTRODE BLDE 4.0 EZ CLN MEGD (MISCELLANEOUS) ×1 IMPLANT
ELECTRODE REM PT RTRN 9FT ADLT (ELECTROSURGICAL) ×2 IMPLANT
EVACUATOR SILICONE 100CC (DRAIN) ×2 IMPLANT
GAUZE PAD ABD 8X10 STRL (GAUZE/BANDAGES/DRESSINGS) ×2 IMPLANT
GAUZE SPONGE 2X2 STRL 8-PLY (GAUZE/BANDAGES/DRESSINGS) ×2 IMPLANT
GLOVE BIO SURGEON STRL SZ 6.5 (GLOVE) IMPLANT
GLOVE BIO SURGEON STRL SZ7.5 (GLOVE) IMPLANT
GLOVE BIO SURGEON STRL SZ8 (GLOVE) ×1 IMPLANT
GLOVE BIOGEL PI IND STRL 7.0 (GLOVE) IMPLANT
GLOVE BIOGEL PI IND STRL 8 (GLOVE) ×1 IMPLANT
GOWN STRL REUS W/ TWL LRG LVL3 (GOWN DISPOSABLE) ×1 IMPLANT
GOWN STRL REUS W/TWL XL LVL3 (GOWN DISPOSABLE) ×1 IMPLANT
HIBICLENS CHG 4% 4OZ BTL (MISCELLANEOUS) ×1 IMPLANT
MANIFOLD NEPTUNE II (INSTRUMENTS) ×1 IMPLANT
NDL HYPO 22X1.5 SAFETY MO (MISCELLANEOUS) ×2 IMPLANT
NEEDLE HYPO 22X1.5 SAFETY MO (MISCELLANEOUS) ×2 IMPLANT
PACK BASIN DAY SURGERY FS (CUSTOM PROCEDURE TRAY) ×1 IMPLANT
PACK UNIVERSAL I (CUSTOM PROCEDURE TRAY) ×1 IMPLANT
PENCIL SMOKE EVACUATOR (MISCELLANEOUS) ×2 IMPLANT
PIN SAFETY STERILE (MISCELLANEOUS) ×1 IMPLANT
POWDER SURGICEL 3.0 GRAM (HEMOSTASIS) IMPLANT
SLEEVE SCD COMPRESS KNEE MED (STOCKING) ×1 IMPLANT
SOLN 0.9% NACL POUR BTL 1000ML (IV SOLUTION) ×1 IMPLANT
SPONGE T-LAP 18X18 ~~LOC~~+RFID (SPONGE) ×2 IMPLANT
STAPLER INSORB 30 2030 C-SECTI (MISCELLANEOUS) IMPLANT
STAPLER SKIN PROX WIDE 3.9 (STAPLE) ×1 IMPLANT
SUT MNCRL AB 3-0 PS2 27 (SUTURE) ×4 IMPLANT
SUT MNCRL AB 4-0 PS2 18 (SUTURE) ×2 IMPLANT
SUT PDS AB 2-0 CT1 27 (SUTURE) ×4 IMPLANT
SUT PROLENE 0 CT 2 (SUTURE) IMPLANT
SUT SILK 2 0 SH (SUTURE) ×2 IMPLANT
SUT VIC AB 3-0 PS1 18XBRD (SUTURE) IMPLANT
SUT VIC AB 3-0 SH 27X BRD (SUTURE) ×1 IMPLANT
SUT VICRYL AB 3 0 TIES (SUTURE) IMPLANT
SYR 20ML LL LF (SYRINGE) ×2 IMPLANT
SYR BULB IRRIG 60ML STRL (SYRINGE) IMPLANT
SYR CONTROL 10ML LL (SYRINGE) ×1 IMPLANT
TOWEL GREEN STERILE FF (TOWEL DISPOSABLE) ×2 IMPLANT
TRAY DSU PREP LF (CUSTOM PROCEDURE TRAY) ×1 IMPLANT
TUBE CONNECTING 20X1/4 (TUBING) ×1 IMPLANT
UNDERPAD 30X36 HEAVY ABSORB (UNDERPADS AND DIAPERS) ×2 IMPLANT
YANKAUER SUCT BULB TIP NO VENT (SUCTIONS) ×1 IMPLANT

## 2024-03-14 NOTE — Anesthesia Preprocedure Evaluation (Signed)
"                                    Anesthesia Evaluation  Patient identified by MRN, date of birth, ID band Patient awake    Reviewed: Allergy & Precautions, NPO status , Patient's Chart, lab work & pertinent test results  History of Anesthesia Complications Negative for: history of anesthetic complications  Airway Mallampati: II  TM Distance: >3 FB Neck ROM: Full    Dental no notable dental hx. (+) Teeth Intact   Pulmonary neg pulmonary ROS, neg sleep apnea, neg COPD, Patient abstained from smoking.Not current smoker   Pulmonary exam normal breath sounds clear to auscultation       Cardiovascular Exercise Tolerance: Good METShypertension, Pt. on medications (-) CAD and (-) Past MI (-) dysrhythmias  Rhythm:Regular Rate:Normal - Systolic murmurs    Neuro/Psych  PSYCHIATRIC DISORDERS Anxiety     negative neurological ROS     GI/Hepatic ,neg GERD  ,,(+)     (-) substance abuse    Endo/Other  neg diabetes    Renal/GU negative Renal ROS     Musculoskeletal   Abdominal  (+) + obese  Peds  Hematology   Anesthesia Other Findings Panniculectomy 11/18 with postop wound infection   Reproductive/Obstetrics                              Anesthesia Physical Anesthesia Plan  ASA: 2  Anesthesia Plan: General   Post-op Pain Management: Toradol  IV (intra-op)* and Ofirmev  IV (intra-op)*   Induction: Intravenous  PONV Risk Score and Plan: 3 and Ondansetron , Dexamethasone  and Midazolam   Airway Management Planned: LMA  Additional Equipment: None  Intra-op Plan:   Post-operative Plan: Extubation in OR  Informed Consent: I have reviewed the patients History and Physical, chart, labs and discussed the procedure including the risks, benefits and alternatives for the proposed anesthesia with the patient or authorized representative who has indicated his/her understanding and acceptance.     Dental advisory given  Plan Discussed  with: CRNA and Surgeon  Anesthesia Plan Comments:          Anesthesia Quick Evaluation  "

## 2024-03-14 NOTE — Anesthesia Procedure Notes (Signed)
 Procedure Name: LMA Insertion Date/Time: 03/14/2024 1:34 PM  Performed by: Orley Lawry D, CRNAPre-anesthesia Checklist: Patient identified, Emergency Drugs available, Suction available and Patient being monitored Patient Re-evaluated:Patient Re-evaluated prior to induction Oxygen Delivery Method: Circle system utilized Preoxygenation: Pre-oxygenation with 100% oxygen Induction Type: IV induction Ventilation: Mask ventilation without difficulty LMA: LMA inserted LMA Size: 4.0 Tube type: Oral Number of attempts: 1 Placement Confirmation: positive ETCO2 and breath sounds checked- equal and bilateral Tube secured with: Tape Dental Injury: Teeth and Oropharynx as per pre-operative assessment

## 2024-03-14 NOTE — Transfer of Care (Signed)
 Immediate Anesthesia Transfer of Care Note  Patient: Dawn Dawson  Procedure(s) Performed: DEBRIDEMENT, WOUND, WITH CLOSURE (Abdomen)  Patient Location: PACU  Anesthesia Type:General  Level of Consciousness: awake and patient cooperative  Airway & Oxygen Therapy: Patient Spontanous Breathing and Patient connected to face mask oxygen  Post-op Assessment: Report given to RN and Post -op Vital signs reviewed and stable  Post vital signs: Reviewed and stable  Last Vitals:  Vitals Value Taken Time  BP 136/82 03/14/24 16:49  Temp 36.8 C 03/14/24 16:49  Pulse 82 03/14/24 16:55  Resp 21 03/14/24 16:55  SpO2 100 % 03/14/24 16:55  Vitals shown include unfiled device data.  Last Pain:  Vitals:   03/14/24 1235  TempSrc: Tympanic  PainSc: 5       Patients Stated Pain Goal: 7 (03/14/24 1235)  Complications: No notable events documented.

## 2024-03-14 NOTE — Progress Notes (Signed)
 Last time received oxycodone  written on dc paperwork.

## 2024-03-14 NOTE — Discharge Instructions (Addendum)
 " Post Anesthesia Home Care Instructions  Activity: Get plenty of rest for the remainder of the day. A responsible individual must stay with you for 24 hours following the procedure.  For the next 24 hours, DO NOT: -Drive a car -Advertising copywriter -Drink alcoholic beverages -Take any medication unless instructed by your physician -Make any legal decisions or sign important papers.  Meals: Start with liquid foods such as gelatin or soup. Progress to regular foods as tolerated. Avoid greasy, spicy, heavy foods. If nausea and/or vomiting occur, drink only clear liquids until the nausea and/or vomiting subsides. Call your physician if vomiting continues.  Special Instructions/Symptoms: Your throat may feel dry or sore from the anesthesia or the breathing tube placed in your throat during surgery. If this causes discomfort, gargle with warm salt water. The discomfort should disappear within 24 hours.  If you had a scopolamine  patch placed behind your ear for the management of post- operative nausea and/or vomiting:  1. The medication in the patch is effective for 72 hours, after which it should be removed.  Wrap patch in a tissue and discard in the trash. Wash hands thoroughly with soap and water. 2. You may remove the patch earlier than 72 hours if you experience unpleasant side effects which may include dry mouth, dizziness or visual disturbances. 3. Avoid touching the patch. Wash your hands with soap and water after contact with the patch.     Information for Discharge Teaching: EXPAREL  (bupivacaine  liposome injectable suspension)   Pain relief is important to your recovery. The goal is to control your pain so you can move easier and return to your normal activities as soon as possible after your procedure. Your physician may use several types of medicines to manage pain, swelling, and more.  Your surgeon or anesthesiologist gave you EXPAREL (bupivacaine ) to help control your pain after  surgery.  EXPAREL  is a local anesthetic designed to release slowly over an extended period of time to provide pain relief by numbing the tissue around the surgical site. EXPAREL  is designed to release pain medication over time and can control pain for up to 72 hours. Depending on how you respond to EXPAREL , you may require less pain medication during your recovery. EXPAREL  can help reduce or eliminate the need for opioids during the first few days after surgery when pain relief is needed the most. EXPAREL  is not an opioid and is not addictive. It does not cause sleepiness or sedation.   Important! A teal colored band has been placed on your arm with the date, time and amount of EXPAREL  you have received. Please leave this armband in place for the full 96 hours following administration, and then you may remove the band. If you return to the hospital for any reason within 96 hours following the administration of EXPAREL , the armband provides important information that your health care providers to know, and alerts them that you have received this anesthetic.    Possible side effects of EXPAREL : Temporary loss of sensation or ability to move in the area where medication was injected. Nausea, vomiting, constipation Rarely, numbness and tingling in your mouth or lips, lightheadedness, or anxiety may occur. Call your doctor right away if you think you may be experiencing any of these sensations, or if you have other questions regarding possible side effects.  Follow all other discharge instructions given to you by your surgeon or nurse. Eat a healthy diet and drink plenty of water or other fluids.  Please leave binder  in place until tomorrow evening. Please ambulate a minimum of 10 minutes/h while awake. No heavy lifting greater than 20 pounds, no vigorous activity. All other activity as tolerated. Please call the office tomorrow-[phone]- morning for instructions on the right thigh  dressing.   Last received tylenol  at 530pm "

## 2024-03-14 NOTE — Op Note (Signed)
 DATE OF OPERATION: 03/14/2024  LOCATION: [facility] operating Room  PREOPERATIVE DIAGNOSIS: Persistent drainage after panniculectomy  POSTOPERATIVE DIAGNOSIS: Large seroma, possibly infected  PROCEDURE: Exploration of panniculectomy site, resection of seroma cavity, drain placement  SURGEON: Marinell Birmingham, MD  ASSISTANT: Honora Seip  EBL: 150 cc  CONDITION: Stable  COMPLICATIONS: None  INDICATION: The patient, Dawn Dawson, is a 38 y.o. female born on 08/19/1985, is here for treatment persistent drainage from previous panniculectomy site.   PROCEDURE DETAILS:  The patient was seen prior to surgery and marked.  The IV antibiotics were given. The patient was taken to the operating room and given a general anesthetic. A standard time out was performed and all information was confirmed by those in the room. SCDs were placed.   The abdomen was prepped and draped in usual sterile manner.  The right lateral half of the incision was excised and dissection carried out down to the seroma cavity with the electrocautery.  Approximately 50 mL of turbid looking fluid was encountered and removed.  The fluid was collected and sent for aerobic and anaerobic culture.  The cavity extended across the midline so I elected to excise a portion of the lateral incision as well.  The entire site was opened and a large granulating seroma cavity was encountered.  I resected a portion of the seroma cavity 28 cm x 10 cm.  There was a small amount of the cavity stuck to the anterior abdominal wall which was fulgurated.  This was approximately 10 x 10 cm.  Hemostasis was achieved with the electrocautery.  The wound was irrigated with 2 L of warm normal saline.  The subfascial and subcutaneous tissues were infiltrated with 100 mL of a combination of Exparel , quarter percent plain Marcaine , saline.  A 19 French round drain was placed in the surgical bed and brought out through a separate stab incision.  The surgical  surfaces were then covered with TXA soaked sponges which were allowed to dwell for 5 minutes.  Once these were removed again I inspected the wound for bleeding and coated the surfaces with Surgicel powder.  The previous drain site on the right hip had a small amount of granulation tissue so I excised this and left the wound open.  The portion of granulation tissue removed was approximately 5 mm.  The deep tissues were closed with interrupted 2-0 PDS sutures.  The dermis was closed with interrupted and running 3-0 Monocryl sutures and the skin was closed with a running 4-0 Monocryl subcuticular stitch.  A compressive binder and dressings were placed.  The patient was awakened from anesthesia without incident transferred to the recovery room in good condition.  All instrument needle and sponge counts reported as correct and no complications were appreciated during the procedure The patient was allowed to wake up and taken to recovery room in stable condition at the end of the case. The family was notified at the end of the case.   The advanced practice practitioner (APP) assisted throughout the case.  The APP was essential in retraction and counter traction when needed to make the case progress smoothly.  This retraction and assistance made it possible to see the tissue plans for the procedure.  The assistance was needed for blood control, tissue re-approximation and assisted with closure of the incision site.

## 2024-03-14 NOTE — Interval H&P Note (Signed)
 History and Physical Interval Note: No change in exam or indication for surgery All questions answered Abdomen marked Will proceed with washout of panniculectomy site and drain placement  03/14/2024 1:10 PM  Dawn Dawson  has presented today for surgery, with the diagnosis of [id number].  The various methods of treatment have been discussed with the patient and family. After consideration of risks, benefits and other options for treatment, the patient has consented to  Procedures with comments: DEBRIDEMENT, WOUND, WITH CLOSURE (N/A) - Washout of panniculectomy wound with replacement of drain as a surgical intervention.  The patient's history has been reviewed, patient examined, no change in status, stable for surgery.  I have reviewed the patient's chart and labs.  Questions were answered to the patient's satisfaction.     Leonce KATHEE Birmingham

## 2024-03-15 ENCOUNTER — Encounter (HOSPITAL_BASED_OUTPATIENT_CLINIC_OR_DEPARTMENT_OTHER): Payer: Self-pay | Admitting: Plastic Surgery

## 2024-03-16 NOTE — Anesthesia Postprocedure Evaluation (Signed)
"   Anesthesia Post Note  Patient: Dawn Dawson  Procedure(s) Performed: DEBRIDEMENT, WOUND, WITH CLOSURE (Abdomen)     Patient location during evaluation: PACU Anesthesia Type: General Level of consciousness: awake and alert, patient cooperative and oriented Pain management: pain level controlled Vital Signs Assessment: post-procedure vital signs reviewed and stable Respiratory status: spontaneous breathing, nonlabored ventilation and respiratory function stable Cardiovascular status: blood pressure returned to baseline and stable Postop Assessment: no apparent nausea or vomiting Anesthetic complications: no Comments: Delayed note, pt eval in PACU post op    No notable events documented.  Last Vitals:  Vitals:   03/14/24 1715 03/14/24 1750  BP: 135/77 (!) 145/85  Pulse: 73 72  Resp: 17 16  Temp:  36.9 C  SpO2: 97% 100%    Last Pain:  Vitals:   03/14/24 1753  TempSrc:   PainSc: 7                  Abdiel Blackerby,E. Halley Kincer      "

## 2024-03-17 LAB — AEROBIC CULTURE W GRAM STAIN (SUPERFICIAL SPECIMEN)

## 2024-03-20 ENCOUNTER — Encounter: Admitting: Plastic Surgery

## 2024-03-22 ENCOUNTER — Ambulatory Visit: Admitting: Plastic Surgery

## 2024-03-22 ENCOUNTER — Encounter: Payer: Self-pay | Admitting: Plastic Surgery

## 2024-03-22 VITALS — BP 126/77 | HR 90 | Wt 205.2 lb

## 2024-03-22 DIAGNOSIS — Z9889 Other specified postprocedural states: Secondary | ICD-10-CM

## 2024-03-22 DIAGNOSIS — T8149XA Infection following a procedure, other surgical site, initial encounter: Secondary | ICD-10-CM

## 2024-03-24 NOTE — Progress Notes (Signed)
 Dawn Dawson returns for evaluation after panniculectomy and return to the OR for washout of panniculectomy site.  She is doing well.  The drain output remains above 30 mL/day.  Pain is well-controlled.  The incisions are clean dry and intact.  Cultures returned Citrobacter and Klebsiella both of which were sensitive to Bactrim .  Continue to slowly progress activity.  Will continue drains until they are clearly less than 30 mL per 24 hours.  Follow-up January 14 sooner if drains are ready.

## 2024-04-05 ENCOUNTER — Encounter: Payer: Self-pay | Admitting: Student

## 2024-04-05 ENCOUNTER — Ambulatory Visit (INDEPENDENT_AMBULATORY_CARE_PROVIDER_SITE_OTHER): Admitting: Student

## 2024-04-05 VITALS — BP 135/92 | HR 80 | Ht 63.0 in | Wt 204.0 lb

## 2024-04-05 DIAGNOSIS — Z9889 Other specified postprocedural states: Secondary | ICD-10-CM

## 2024-04-05 NOTE — Progress Notes (Signed)
 Patient is a 39 year old female who initially underwent panniculectomy with Dr. Waddell on 02/08/2024.  Her postoperative course was complicated by bleeding, cellulitis and persistent draining from her right drain site.  Patient was taken back to the operating room for exploration of panniculectomy site, resection of seroma cavity and drain placement with Dr. Waddell on 03/14/2024.  Patient is approximately 3 weeks postop.  She presents to the clinic today for postoperative follow-up.  Patient was last seen in the clinic on 03/22/2024.  At this visit, patient was doing well.  The drain output remained above 30 mL/day.  Pain was well-controlled.  The incisions were clean dry and intact.  Cultures did return with Citrobacter and Klebsiella, both of which were sensitive to Bactrim .  Plan was for patient to continue to slowly progress with activity.  Today, patient presents with her husband at bedside.  She reports that she has been doing well.  She denies any new issues or concerns.  She denies any fevers or chills.  She denies any drainage from her incision.  She reports that her drain output has been approximately 10 to 15 cc/day for several days.  She is requesting drain removal today.  Chaperone present on exam.  On exam, patient sitting upright in no acute distress.  Abdomen is soft and nontender.  There is no overlying erythema, no obvious fluid collection palpated on exam.  Incision appears to be clean dry and intact.  JP drain is in place with minimal serous drainage in the bulb.  There are no signs of infection on exam.  JP drain was removed without any difficulty.  Patient tolerated well.  Wound was cleaned with Vashe and a Mepilex border dressing was placed.  Discussed with the patient that her drain site may drain over the next few days.  Recommended that she apply gauze and tape over the area.  Discussed with her that starting on Friday or Saturday, she may apply Vaseline over the drain site daily.   She expressed understanding.  I discussed with the patient the importance of continue with compression at all times and avoiding strenuous activities.  She expressed understanding.  Patient to follow back up next week.  I instructed her to call in the meantime she has any questions or concerns about anything.

## 2024-04-12 ENCOUNTER — Encounter: Admitting: Student

## 2024-04-13 ENCOUNTER — Telehealth: Payer: Self-pay | Admitting: Student

## 2024-04-13 NOTE — Telephone Encounter (Signed)
 Dawn Dawson is requesting that you add what px pt had done, pann as well as the debridement and any other surgeries to the completed forms.  Please let me know what I can do to assist

## 2024-04-20 ENCOUNTER — Ambulatory Visit: Admitting: Plastic Surgery

## 2024-04-20 DIAGNOSIS — T8149XA Infection following a procedure, other surgical site, initial encounter: Secondary | ICD-10-CM

## 2024-04-20 DIAGNOSIS — Z9889 Other specified postprocedural states: Secondary | ICD-10-CM

## 2024-04-20 NOTE — Progress Notes (Signed)
 Ms. Dawn Dawson returns today after undergoing a panniculectomy November 18 complicated by a hematoma and subsequent wound infection requiring return to the operating room December 23.  Since that time she has had an uncomplicated course.  Drains were removed without difficulty and she returns today with no specific complaints.   On evaluation the incision is clean dry and intact and appears to be healing nicely.  Overall she has an excellent result.  Status post panniculectomy with complication: Doing well slowly increase activity wear compression for an additional 2 weeks.  Follow-up with me in 4 to 6 weeks.

## 2024-05-25 ENCOUNTER — Encounter: Admitting: Plastic Surgery
